# Patient Record
Sex: Female | Born: 1975 | Race: White | Hispanic: Yes | Marital: Married | Smoking: Never smoker
Health system: Southern US, Community
[De-identification: ages and names within clinical notes are randomized; demographics above are authoritative.]

## PROBLEM LIST (undated history)

## (undated) ENCOUNTER — Inpatient Hospital Stay (HOSPITAL_COMMUNITY): Payer: Self-pay

## (undated) DIAGNOSIS — M3500 Sicca syndrome, unspecified: Secondary | ICD-10-CM

## (undated) HISTORY — PX: BREAST SURGERY: SHX581

## (undated) HISTORY — DX: Sjogren syndrome, unspecified: M35.00

## (undated) HISTORY — PX: NO PAST SURGERIES: SHX2092

---

## 2013-04-20 ENCOUNTER — Ambulatory Visit (INDEPENDENT_AMBULATORY_CARE_PROVIDER_SITE_OTHER): Payer: BC Managed Care – PPO | Admitting: Physician Assistant

## 2013-04-20 VITALS — BP 120/75 | HR 80 | Temp 98.9°F | Resp 18 | Wt 121.0 lb

## 2013-04-20 DIAGNOSIS — R599 Enlarged lymph nodes, unspecified: Secondary | ICD-10-CM

## 2013-04-20 DIAGNOSIS — R591 Generalized enlarged lymph nodes: Secondary | ICD-10-CM

## 2013-04-20 DIAGNOSIS — J069 Acute upper respiratory infection, unspecified: Secondary | ICD-10-CM

## 2013-04-20 DIAGNOSIS — D649 Anemia, unspecified: Secondary | ICD-10-CM

## 2013-04-20 LAB — POCT CBC
Granulocyte percent: 69.2 %G (ref 37–80)
Hemoglobin: 11.4 g/dL — AB (ref 12.2–16.2)
MPV: 8.4 fL (ref 0–99.8)
POC Granulocyte: 3.2 (ref 2–6.9)
POC MID %: 7.4 %M (ref 0–12)
Platelet Count, POC: 267 10*3/uL (ref 142–424)
RBC: 4.08 M/uL (ref 4.04–5.48)
WBC: 4.6 10*3/uL (ref 4.6–10.2)

## 2013-04-20 MED ORDER — BENZONATATE 100 MG PO CAPS: 100.0000 mg | ORAL_CAPSULE | Freq: Three times a day (TID) | ORAL | Status: DC | PRN

## 2013-04-20 MED ORDER — IPRATROPIUM BROMIDE 0.03 % NA SOLN: 2.0000 | Freq: Two times a day (BID) | NASAL | Status: DC

## 2013-04-20 NOTE — Progress Notes (Signed)
  Subjective:    Patient ID: Jillian Macias, female    DOB: 05/25/1976, 37 y.o.   MRN: [id number]  HPI This 37 y.o. female presents for evaluation of a small lump under the LEFT ear, which she believes is a swollen lymph node. 4 days ago she developed a scratchy throat, mild non-productive cough and runny nose.  These symptoms began the day after meeting with a client who had similar symptoms.  Yesterday she noted the mildly tender small lump just below the LEFT ear.  No other palpable nodes.  She notes that with Sjogren's flares, she gets enlarged nodes in the groin and supraclavicular regions. No fever, chills.  No GI/GU symptoms.  Past medical history, surgical history, family history, social history and problem list reviewed. She is followed Q3 months by her rheumatologist is [place], [place].  Last labs 1 month ago.   Review of Systems As above.    Objective:   Physical Exam Blood pressure 120/75, pulse 80, temperature 98.9 F (37.2 C), temperature source Oral, resp. rate 18, weight 121 lb (54.885 kg), last menstrual period 04/13/2013. There is no height on file to calculate BMI. Well-developed, well nourished female who is awake, alert and oriented, in NAD. HEENT: [place]/AT, sclera and conjunctiva are clear.  EAC are patent, TMs are normal in appearance. Nasal mucosa is very congested, pink and moist. OP is clear. Neck: supple, non-tender, no thyromegaly. A small (<1 cm) mildly tender node is palpable just behind the LEFT ear.  No other nodes palpable of the head/neck, supraclavicular area, axilla or epitrochlear areas. Heart: RRR, no murmur Lungs: normal effort, CTA Extremities: no cyanosis, clubbing or edema. Skin: warm and dry without rash. Psychologic: good mood and appropriate affect, normal speech and behavior.   Results for orders placed in visit on 04/20/13  POCT CBC      Result Value Range   WBC 4.6  4.6 - 10.2 K/uL   Lymph, poc 1.1  0.6 - 3.4   POC LYMPH PERCENT 23.4  10 - 50  %L   MID (cbc) 0.3  0 - 0.9   POC MID % 7.4  0 - 12 %M   POC Granulocyte 3.2  2 - 6.9   Granulocyte percent 69.2  37 - 80 %G   RBC 4.08  4.04 - 5.48 M/uL   Hemoglobin 11.4 (*) 12.2 - 16.2 g/dL   HCT, POC 78.4 (*) 69.6 - 47.9 %   MCV 90.6  80 - 97 fL   MCH, POC 27.9  27 - 31.2 pg   MCHC 30.8 (*) 31.8 - 35.4 g/dL   RDW, POC 29.5     Platelet Count, POC 267  142 - 424 K/uL   MPV 8.4  0 - 99.8 fL       Assessment & Plan:  Lymphadenopathy - Plan: POCT CBC; likely due to URI.  RTC if symptoms worsen/persist.  Anemia - likely post-menstrual, but advised to consume an iron-rich diet and follow-up here or with her PCP in 4-8 weeks.  URI (upper respiratory infection) - Plan: ipratropium (ATROVENT) 0.03 % nasal spray, benzonatate (TESSALON) 100 MG capsule; supportive care.  Fernande Bras, PA-C Physician Assistant-Certified [facility]

## 2013-04-20 NOTE — Patient Instructions (Signed)
Get plenty of rest and drink at least 64 ounces of water daily. Be sure you're eating and iron-rich diet, though the mild anemia may be related to your recent menstrual blood loss.

## 2014-10-29 LAB — OB RESULTS CONSOLE GC/CHLAMYDIA
Chlamydia: NEGATIVE
GC PROBE AMP, GENITAL: NEGATIVE

## 2014-10-29 LAB — CYTOLOGY - PAP: Pap: NEGATIVE

## 2014-11-12 NOTE — L&D Delivery Note (Cosign Needed)
Delivery Note At 10:15 PM a viable female was delivered via Vaginal, Spontaneous Delivery (Presentation: Left Occiput Anterior), delivered through nuchal x 1 which was reduced immediately at birth.  APGAR: 9, 9; weight 7 lb 10 oz (3459 g).  Infant placed directly on mom's abdomen for bonding/skin-to-skin. Delayed cord clamping, then cord clamped x 2, and cut by fob.   Placenta status: delivered spontaneously intact .  Cord: 3 vessels with the following complications: .  Cord pH: n/a  Anesthesia: Epidural  Episiotomy:  n/a Lacerations: 2nd degree Suture Repair: 3.0 vicryl Est. Blood Loss (mL):  See delivery summary  Mom to postpartum.  Baby to Couplet care / Skin to Skin. Bottlefeeding d/t plaquenil, COCs for contraception Dr. Macon Large present for birth d/t expecting LGA infant/request of pt/family  Marge Duncans 06/25/2015, 11:10 PM

## 2014-12-06 ENCOUNTER — Other Ambulatory Visit (HOSPITAL_COMMUNITY): Payer: Self-pay | Admitting: Nurse Practitioner

## 2014-12-06 DIAGNOSIS — O09521 Supervision of elderly multigravida, first trimester: Secondary | ICD-10-CM

## 2014-12-06 DIAGNOSIS — Z3682 Encounter for antenatal screening for nuchal translucency: Secondary | ICD-10-CM

## 2014-12-06 LAB — URINE CULTURE: Urine Culture, OB: NEGATIVE

## 2014-12-06 LAB — OB RESULTS CONSOLE VARICELLA ZOSTER ANTIBODY, IGG: VARICELLA IGG: IMMUNE

## 2014-12-06 LAB — OB RESULTS CONSOLE ANTIBODY SCREEN: Antibody Screen: NEGATIVE

## 2014-12-06 LAB — OB RESULTS CONSOLE HGB/HCT, BLOOD
HEMATOCRIT: 35 %
HEMOGLOBIN: 11.3 g/dL

## 2014-12-06 LAB — GLUCOSE TOLERANCE, 1 HOUR (50G) W/O FASTING
GLUCOSE 1 HOUR GTT: 107 mg/dL (ref ?–200)
Glucose, GTT - 1 Hour: 107 mg/dL (ref ?–200)

## 2014-12-06 LAB — OB RESULTS CONSOLE ABO/RH: RH Type: POSITIVE

## 2014-12-06 LAB — OB RESULTS CONSOLE PLATELET COUNT: PLATELETS: 270 10*3/uL

## 2014-12-06 LAB — OB RESULTS CONSOLE HEPATITIS B SURFACE ANTIGEN: Hepatitis B Surface Ag: NEGATIVE

## 2014-12-06 LAB — OB RESULTS CONSOLE RUBELLA ANTIBODY, IGM: Rubella: IMMUNE

## 2014-12-06 LAB — SICKLE CELL SCREEN: Sickle Cell Screen: NORMAL

## 2014-12-06 LAB — CYSTIC FIBROSIS DIAGNOSTIC STUDY: Interpretation-CFDNA:: NEGATIVE

## 2014-12-06 LAB — OB RESULTS CONSOLE RPR: RPR: NONREACTIVE

## 2014-12-16 ENCOUNTER — Inpatient Hospital Stay (HOSPITAL_COMMUNITY)
Admission: AD | Admit: 2014-12-16 | Discharge: 2014-12-16 | Disposition: A | Discharge status: Home / Self Care | Payer: Medicaid Other | Source: Ambulatory Visit | Attending: Family Medicine | Admitting: Family Medicine

## 2014-12-16 ENCOUNTER — Encounter (HOSPITAL_COMMUNITY): Payer: Self-pay | Admitting: *Deleted

## 2014-12-16 DIAGNOSIS — M549 Dorsalgia, unspecified: Secondary | ICD-10-CM | POA: Diagnosis present

## 2014-12-16 DIAGNOSIS — Z3A12 12 weeks gestation of pregnancy: Secondary | ICD-10-CM

## 2014-12-16 DIAGNOSIS — M3500 Sicca syndrome, unspecified: Secondary | ICD-10-CM | POA: Diagnosis not present

## 2014-12-16 DIAGNOSIS — O9989 Other specified diseases and conditions complicating pregnancy, childbirth and the puerperium: Secondary | ICD-10-CM | POA: Diagnosis not present

## 2014-12-16 DIAGNOSIS — Z3490 Encounter for supervision of normal pregnancy, unspecified, unspecified trimester: Secondary | ICD-10-CM

## 2014-12-16 LAB — POCT PREGNANCY, URINE: PREG TEST UR: POSITIVE — AB

## 2014-12-16 LAB — URINALYSIS, ROUTINE W REFLEX MICROSCOPIC
Bilirubin Urine: NEGATIVE
GLUCOSE, UA: NEGATIVE mg/dL
HGB URINE DIPSTICK: NEGATIVE
KETONES UR: NEGATIVE mg/dL
LEUKOCYTES UA: NEGATIVE
NITRITE: NEGATIVE
PH: 5.5 (ref 5.0–8.0)
PROTEIN: NEGATIVE mg/dL
Specific Gravity, Urine: 1.01 (ref 1.005–1.030)
Urobilinogen, UA: 0.2 mg/dL (ref 0.0–1.0)

## 2014-12-16 LAB — SEDIMENTATION RATE: Sed Rate: 72 mm/hr — ABNORMAL HIGH (ref 0–22)

## 2014-12-16 MED ORDER — TOBRAMYCIN 0.3 % OP OINT: 1.0000 "application " | TOPICAL_OINTMENT | Freq: Three times a day (TID) | OPHTHALMIC | Status: DC

## 2014-12-16 MED ORDER — ACETAMINOPHEN 325 MG PO TABS
650.0000 mg | ORAL_TABLET | Freq: Once | ORAL | Status: AC
  Administered 2014-12-16: 650 mg via ORAL
  Filled 2014-12-16: qty 2

## 2014-12-16 MED ORDER — CYCLOBENZAPRINE HCL 10 MG PO TABS
10.0000 mg | ORAL_TABLET | Freq: Once | ORAL | Status: AC
  Administered 2014-12-16: 10 mg via ORAL
  Filled 2014-12-16: qty 1

## 2014-12-16 MED ORDER — HYDROXYCHLOROQUINE SULFATE 200 MG PO TABS: 200.0000 mg | ORAL_TABLET | Freq: Two times a day (BID) | ORAL | Status: DC

## 2014-12-16 NOTE — MAU Note (Addendum)
Pt states she has Sjogren's syndrome is thinks she is having a flare up.  Pt states she does not have the systemic type but can't remember if its type one or two Sjogren's.  Reports upper back pain today more on the right upper side at shoulder blade which kept her awake all night.  Also states she has swollen eyes x 1 week.  Pt reports one episode of diarrhea today.

## 2014-12-16 NOTE — Discharge Instructions (Signed)
First Trimester of Pregnancy °The first trimester of pregnancy is from week 1 until the end of week 12 (months 1 through 3). A week after a sperm fertilizes an egg, the egg will implant on the wall of the uterus. This embryo will begin to develop into a baby. Genes from you and your partner are forming the baby. The female genes determine whether the baby is a boy or a girl. At 6-8 weeks, the eyes and face are formed, and the heartbeat can be seen on ultrasound. At the end of 12 weeks, all the baby's organs are formed.  °Now that you are pregnant, you will want to do everything you can to have a healthy baby. Two of the most important things are to get good prenatal care and to follow your health care provider's instructions. Prenatal care is all the medical care you receive before the baby's birth. This care will help prevent, find, and treat any problems during the pregnancy and childbirth. °BODY CHANGES °Your body goes through many changes during pregnancy. The changes vary from woman to woman.  °· You may gain or lose a couple of pounds at first. °· You may feel sick to your stomach (nauseous) and throw up (vomit). If the vomiting is uncontrollable, call your health care provider. °· You may tire easily. °· You may develop headaches that can be relieved by medicines approved by your health care provider. °· You may urinate more often. Painful urination may mean you have a bladder infection. °· You may develop heartburn as a result of your pregnancy. °· You may develop constipation because certain hormones are causing the muscles that push waste through your intestines to slow down. °· You may develop hemorrhoids or swollen, bulging veins (varicose veins). °· Your breasts may begin to grow larger and become tender. Your nipples may stick out more, and the tissue that surrounds them (areola) may become darker. °· Your gums may bleed and may be sensitive to brushing and flossing. °· Dark spots or blotches (chloasma,  mask of pregnancy) may develop on your face. This will likely fade after the baby is born. °· Your menstrual periods will stop. °· You may have a loss of appetite. °· You may develop cravings for certain kinds of food. °· You may have changes in your emotions from day to day, such as being excited to be pregnant or being concerned that something may go wrong with the pregnancy and baby. °· You may have more vivid and strange dreams. °· You may have changes in your hair. These can include thickening of your hair, rapid growth, and changes in texture. Some women also have hair loss during or after pregnancy, or hair that feels dry or thin. Your hair will most likely return to normal after your baby is born. °WHAT TO EXPECT AT YOUR PRENATAL VISITS °During a routine prenatal visit: °· You will be weighed to make sure you and the baby are growing normally. °· Your blood pressure will be taken. °· Your abdomen will be measured to track your baby's growth. °· The fetal heartbeat will be listened to starting around week 10 or 12 of your pregnancy. °· Test results from any previous visits will be discussed. °Your health care provider may ask you: °· How you are feeling. °· If you are feeling the baby move. °· If you have had any abnormal symptoms, such as leaking fluid, bleeding, severe headaches, or abdominal cramping. °· If you have any questions. °Other tests   that may be performed during your first trimester include: °· Blood tests to find your blood type and to check for the presence of any previous infections. They will also be used to check for low iron levels (anemia) and Rh antibodies. Later in the pregnancy, blood tests for diabetes will be done along with other tests if problems develop. °· Urine tests to check for infections, diabetes, or protein in the urine. °· An ultrasound to confirm the proper growth and development of the baby. °· An amniocentesis to check for possible genetic problems. °· Fetal screens for  spina bifida and Down syndrome. °· You may need other tests to make sure you and the baby are doing well. °HOME CARE INSTRUCTIONS  °Medicines °· Follow your health care provider's instructions regarding medicine use. Specific medicines may be either safe or unsafe to take during pregnancy. °· Take your prenatal vitamins as directed. °· If you develop constipation, try taking a stool softener if your health care provider approves. °Diet °· Eat regular, well-balanced meals. Choose a variety of foods, such as meat or vegetable-based protein, fish, milk and low-fat dairy products, vegetables, fruits, and whole grain breads and cereals. Your health care provider will help you determine the amount of weight gain that is right for you. °· Avoid raw meat and uncooked cheese. These carry germs that can cause birth defects in the baby. °· Eating four or five small meals rather than three large meals a day may help relieve nausea and vomiting. If you start to feel nauseous, eating a few soda crackers can be helpful. Drinking liquids between meals instead of during meals also seems to help nausea and vomiting. °· If you develop constipation, eat more high-fiber foods, such as fresh vegetables or fruit and whole grains. Drink enough fluids to keep your urine clear or pale yellow. °Activity and Exercise °· Exercise only as directed by your health care provider. Exercising will help you: °¨ Control your weight. °¨ Stay in shape. °¨ Be prepared for labor and delivery. °· Experiencing pain or cramping in the lower abdomen or low back is a good sign that you should stop exercising. Check with your health care provider before continuing normal exercises. °· Try to avoid standing for long periods of time. Move your legs often if you must stand in one place for a long time. °· Avoid heavy lifting. °· Wear low-heeled shoes, and practice good posture. °· You may continue to have sex unless your health care provider directs you  otherwise. °Relief of Pain or Discomfort °· Wear a good support bra for breast tenderness.   °· Take warm sitz baths to soothe any pain or discomfort caused by hemorrhoids. Use hemorrhoid cream if your health care provider approves.   °· Rest with your legs elevated if you have leg cramps or low back pain. °· If you develop varicose veins in your legs, wear support hose. Elevate your feet for 15 minutes, 3-4 times a day. Limit salt in your diet. °Prenatal Care °· Schedule your prenatal visits by the twelfth week of pregnancy. They are usually scheduled monthly at first, then more often in the last 2 months before delivery. °· Write down your questions. Take them to your prenatal visits. °· Keep all your prenatal visits as directed by your health care provider. °Safety °· Wear your seat belt at all times when driving. °· Make a list of emergency phone numbers, including numbers for family, friends, the hospital, and police and fire departments. °General Tips °·   Ask your health care provider for a referral to a local prenatal education class. Begin classes no later than at the beginning of month 6 of your pregnancy.  Ask for help if you have counseling or nutritional needs during pregnancy. Your health care provider can offer advice or refer you to specialists for help with various needs.  Do not use hot tubs, steam rooms, or saunas.  Do not douche or use tampons or scented sanitary pads.  Do not cross your legs for long periods of time.  Avoid cat litter boxes and soil used by cats. These carry germs that can cause birth defects in the baby and possibly loss of the fetus by miscarriage or stillbirth.  Avoid all smoking, herbs, alcohol, and medicines not prescribed by your health care provider. Chemicals in these affect the formation and growth of the baby.  Schedule a dentist appointment. At home, brush your teeth with a soft toothbrush and be gentle when you floss. SEEK MEDICAL CARE IF:   You have  dizziness.  You have mild pelvic cramps, pelvic pressure, or nagging pain in the abdominal area.  You have persistent nausea, vomiting, or diarrhea.  You have a bad smelling vaginal discharge.  You have pain with urination.  You notice increased swelling in your face, hands, legs, or ankles. SEEK IMMEDIATE MEDICAL CARE IF:   You have a fever.  You are leaking fluid from your vagina.  You have spotting or bleeding from your vagina.  You have severe abdominal cramping or pain.  You have rapid weight gain or loss.  You vomit blood or material that looks like coffee grounds.  You are exposed to [place] measles and have never had them.  You are exposed to fifth disease or chickenpox.  You develop a severe headache.  You have shortness of breath.  You have any kind of trauma, such as from a fall or a car accident. Document Released: 10/23/2001 Document Revised: 03/15/2014 Document Reviewed: 09/08/2013 [facility] Patient Information 2015 [place], [place]. This information is not intended to replace advice given to you by your health care provider. Make sure you discuss any questions you have with your health care provider. Sjogren Syndrome Sjogren syndrome is a disease in which the body's natural defense system (immune system) turns against the body's own cells and attacks the glands that produce tears and saliva. Sjogren syndrome is sometimes linked to rheumatic disorders, such as rheumatoid arthritis and lupus. It is 10 times more common in women than in men. Most people with Sjogren syndrome are from 49 to 6 years of age. CAUSES  The cause is unknown. It runs in families. It is possible that a trigger, such as a viral infection, can set it off. SYMPTOMS  The main symptoms are:  Dry mouth.  Chalky feeling, mouth feels like it is full of cotton.  Difficulty swallowing, speaking, or tasting.  Prone to cavities and mouth infections.  Dry eyes.  Burning, itching, feels like  sand in the eyes.  Blurry vision.  Light sensitive. Other symptoms may include:  Skin, nose, and vaginal dryness.  Joint pain, stiffness, and muscle pain. Other organs that may be affected include:  Kidneys.  Blood vessels.  Lungs.  Liver.  Pancreas.  Brain. DIAGNOSIS  Diagnosis is first based on symptoms, medical history, and physical exam. Tests may also be done, including:  Tear production test (Schirmer test).  A thorough eye exam using a magnifying device (slit-lamp exam).  Test to see the extent of  eye damage using dye staining.  Mouth exam to look for signs of salivary gland swelling and mouth dryness.  Removal of a minor salivary gland from inside the lower lip to be studied under a microscope (lip biopsy).  Blood tests. Routine and special blood studies will be checked. Antibodies that attack normal tissue (autoantibodies) may be present, such as antinuclear antibodies (ANAs), rheumatoid factors, and Sjogren antibodies (anti-SSA and anti-SSB).  Chest X-ray.  Urine tests (urinalysis). TREATMENT  There is no known cure for this syndrome. There is no specific treatment to restore gland secretion, either. Treatment varies and is generally based upon the problems present.  Moisture replacement therapies may ease the symptoms of dryness.  Nonsteroidal anti-inflammatory drugs (NSAIDs), such as ibuprofen, may be used to treat musculoskeletal symptoms.  Corticosteroids, such as prednisone, may be given for people with severe complications.  Immunosuppressive drugs may be prescribed to control overactivity of the immune system. In severe cases, this overactivity can lead to organ damage.  A surgical procedure called punctal occlusion may be considered. This is done to close the tear ducts, helping to keep more natural tears on the eye's surface. A small percentage of people with Sjogren syndrome develop lymphoma. If you are worried that you might develop lymphoma,  talk to your caregiver to learn more about the disease and the symptoms to watch. HOME CARE INSTRUCTIONS   Eye care:  Use eye drops as specified by your caregiver.  Try to blink 5 to 6 times per minute.  Protect your eyes from drafts and breezes.  Maintain properly humidified air.  Avoid smoke.  Mouth care:  Sugar-free gum and hard candy can help in some people.  Take frequent sips of water or sugar-free drinks.  Use lip balm, saliva substitutes, and prescription medicines as directed. SEEK MEDICAL CARE IF:   You have an oral temperature above 102 F (38.9 C).  You have night sweats.  You develop constant fatigue.  You have unexplained weight loss.  You develop itchy skin.  You have reddened patches on the skin. FOR MORE INFORMATION  Sjogren's Syndrome Foundation: www.sjogrens.[facility] National Institute of Arthritis and Musculoskeletal and Skin Diseases: www.niams.http://www.myers.net/nih.gov Document Released: 10/19/2002 Document Revised: 03/15/2014 Document Reviewed: 03/06/2010 [facility] Patient Information 2015 [place]. This information is not intended to replace advice given to you by your health care provider. Make sure you discuss any questions you have with your health care provider.

## 2014-12-16 NOTE — MAU Provider Note (Signed)
History     CSN: [id number]  Arrival date and time: 12/16/14 1038   First Provider Initiated Contact with Patient 12/16/14 1157      Chief Complaint  Patient presents with  . Back Pain   HPI Comments: Natally Ribera 39 y.o. G2P0010 [id number] a PMHx of Sjogren's presenting with a 1 week history of dry, swollen eyes. She reports that these are similar symptoms to her normal Sjogren's flare but more severe. She went to an ophthomalogist a few days ago because her eyes were red, itching, her eyelids were swollen shut and she had green mucoid discharge. He gave her tobramycin 0.3% drops. She reports that it helped clear the mucus up and bring the swelling down but she is still experiencing red eye, "gritty sand feeling", mild swelling and photophobia. She is concerned because her typical flares last 2-3 days and this one has lasted 1 week. She recently moved here and does not established care with a rheumatologist. She is currently taking plaquenil 200 mg QD. She reports no increase in dry mouth symptoms. When she has a flare in past the Rheumatologist will increase her dose of Plaquinil to 200 mg BID  She is also complaining of sudden onset back pain. She reports that severe right upper and mid back pain woke her multiple times in the night. It is described as sharp. It intensified and radiates up to her right posterior neck with movement. Certain position aggravate the pain. Lying still alleviates the pain. She does not recall trauma to the area or overexerting herself. She does not work out. She has not taken any OTC medications to relieve the pain. There is no associated muscle weakness or sensory changes of the arms or legs, bowel incontinence, saddle anesthesia or pain over the spine.  She also reports a single episode of diarrhea this morning while she was at work. She states that she had sudden intense urgency and had one loose stool. No blood in the stool. No abdominal pain associated with the  bowel movement. She reports no feelings of urgency at the present time.   Back Pain Pertinent negatives include no abdominal pain, chest pain, dysuria, fever, headaches or weakness.      Past Medical History  Diagnosis Date  . Sjogren's syndrome     Past Surgical History  Procedure Laterality Date  . Breast surgery      Family History  Problem Relation Age of Onset  . Cancer Maternal Grandmother     History  Substance Use Topics  . Smoking status: Never Smoker   . Smokeless tobacco: Not on file  . Alcohol Use: Yes    Allergies: No Known Allergies  Prescriptions prior to admission  Medication Sig Dispense Refill Last Dose  . benzonatate (TESSALON) 100 MG capsule Take 1-2 capsules (100-200 mg total) by mouth 3 (three) times daily as needed for cough. 40 capsule 0   . ipratropium (ATROVENT) 0.03 % nasal spray Place 2 sprays into the nose 2 (two) times daily. 30 mL 0     Review of Systems  Constitutional: Positive for malaise/fatigue. Negative for fever and chills.  Eyes: Positive for blurred vision, photophobia, pain, discharge and redness.  Respiratory: Negative for cough, shortness of breath and wheezing.   Cardiovascular: Negative for chest pain and palpitations.  Gastrointestinal: Positive for nausea (morning sickness) and diarrhea. Negative for heartburn, vomiting, abdominal pain and blood in stool.  Genitourinary: Negative for dysuria and hematuria.  Musculoskeletal: Positive for back pain. Negative  for joint pain and falls.  Skin: Negative for rash.  Neurological: Negative for dizziness, sensory change, loss of consciousness, weakness and headaches.   Physical Exam   Blood pressure 130/79, pulse 74, temperature 97.7 F (36.5 C), temperature source Oral, resp. rate 16, height 5' 2.75" (1.594 m), weight 56.881 kg (125 lb 6.4 oz), last menstrual period 09/21/2014, SpO2 100 %.  Physical Exam  Constitutional: She appears well-developed and well-nourished.  HENT:   Head: Normocephalic.  Eyes: EOM are normal. Pupils are equal, round, and reactive to light. Right eye exhibits no discharge. No foreign body present in the right eye. Left eye exhibits no discharge. No foreign body present in the left eye. Right conjunctiva is injected. Left conjunctiva is injected.  Cardiovascular: Normal rate and normal heart sounds.   Respiratory: Effort normal and breath sounds normal.  GI: Soft. There is no tenderness.  Musculoskeletal:       Thoracic back: She exhibits tenderness. She exhibits no deformity, no laceration and no spasm.       Back:  Skin: Skin is warm and dry.   Results for orders placed or performed during the hospital encounter of 12/16/14 (from the past 24 hour(s))  Urinalysis, Routine w reflex microscopic     Status: None   Collection Time: 12/16/14 11:10 AM  Result Value Ref Range   Color, Urine YELLOW YELLOW   APPearance CLEAR CLEAR   Specific Gravity, Urine 1.010 1.005 - 1.030   pH 5.5 5.0 - 8.0   Glucose, UA NEGATIVE NEGATIVE mg/dL   Hgb urine dipstick NEGATIVE NEGATIVE   Bilirubin Urine NEGATIVE NEGATIVE   Ketones, ur NEGATIVE NEGATIVE mg/dL   Protein, ur NEGATIVE NEGATIVE mg/dL   Urobilinogen, UA 0.2 0.0 - 1.0 mg/dL   Nitrite NEGATIVE NEGATIVE   Leukocytes, UA NEGATIVE NEGATIVE  Pregnancy, urine POC     Status: Abnormal   Collection Time: 12/16/14 11:21 AM  Result Value Ref Range   Preg Test, Ur POSITIVE (A) NEGATIVE  Sedimentation rate     Status: Abnormal   Collection Time: 12/16/14  1:00 PM  Result Value Ref Range   Sed Rate 72 (H) 0 - 22 mm/hr    MAU Course  Procedures  MDM Flexeril and tylenol were helpful with back  And neck pains Dr Nehemiah Settle met with patient and husband and they agreed to in crease Plaquenil to 239m BID She has an appointment with [facility] Thursday   Assessment and Plan   A: Sjogren's syndrome in pregnancy Increase Plaquinil to 200 mg BID Sjogren's syndrome A and B drawn per  MFM Tobrex 1-2 gtts BID to TID Keep appointment with Clinic on Thursday or sooner if needed Return to MAU as needed    BGeorgia Duff2/02/2015, 12:24 PM

## 2014-12-17 ENCOUNTER — Encounter (HOSPITAL_COMMUNITY): Payer: Self-pay

## 2014-12-17 ENCOUNTER — Ambulatory Visit (HOSPITAL_COMMUNITY)
Admission: RE | Admit: 2014-12-17 | Discharge: 2014-12-17 | Disposition: A | Discharge status: Home / Self Care | Payer: Medicaid Other | Source: Ambulatory Visit | Attending: Nurse Practitioner | Admitting: Nurse Practitioner

## 2014-12-17 ENCOUNTER — Other Ambulatory Visit (HOSPITAL_COMMUNITY): Payer: Self-pay | Admitting: Maternal and Fetal Medicine

## 2014-12-17 ENCOUNTER — Other Ambulatory Visit (HOSPITAL_COMMUNITY): Payer: Self-pay

## 2014-12-17 DIAGNOSIS — O09529 Supervision of elderly multigravida, unspecified trimester: Secondary | ICD-10-CM | POA: Insufficient documentation

## 2014-12-17 DIAGNOSIS — O09521 Supervision of elderly multigravida, first trimester: Secondary | ICD-10-CM | POA: Insufficient documentation

## 2014-12-17 DIAGNOSIS — Z315 Encounter for genetic counseling: Secondary | ICD-10-CM | POA: Diagnosis not present

## 2014-12-17 DIAGNOSIS — Z3682 Encounter for antenatal screening for nuchal translucency: Secondary | ICD-10-CM

## 2014-12-17 DIAGNOSIS — Z369 Encounter for antenatal screening, unspecified: Secondary | ICD-10-CM | POA: Insufficient documentation

## 2014-12-17 DIAGNOSIS — M3500 Sicca syndrome, unspecified: Secondary | ICD-10-CM

## 2014-12-17 DIAGNOSIS — Z3A12 12 weeks gestation of pregnancy: Secondary | ICD-10-CM | POA: Insufficient documentation

## 2014-12-17 DIAGNOSIS — Z3A11 11 weeks gestation of pregnancy: Secondary | ICD-10-CM | POA: Insufficient documentation

## 2014-12-17 DIAGNOSIS — O09522 Supervision of elderly multigravida, second trimester: Secondary | ICD-10-CM

## 2014-12-17 LAB — SJOGRENS SYNDROME-B EXTRACTABLE NUCLEAR ANTIBODY: SSB (LA) (ENA) ANTIBODY, IGG: POSITIVE

## 2014-12-17 LAB — SJOGRENS SYNDROME-A EXTRACTABLE NUCLEAR ANTIBODY: SSA (RO) (ENA) ANTIBODY, IGG: POSITIVE

## 2014-12-17 IMAGING — US US MFM FETAL NUCHAL TRANSLUCENCY
1 series · 13 of 28 positions shown · non-contrast
Comparison: none

[Series 1: us mfm fetal nuchal translucency · 0.17mm/px · 13 of 30 slices shown]
[im 2/30]
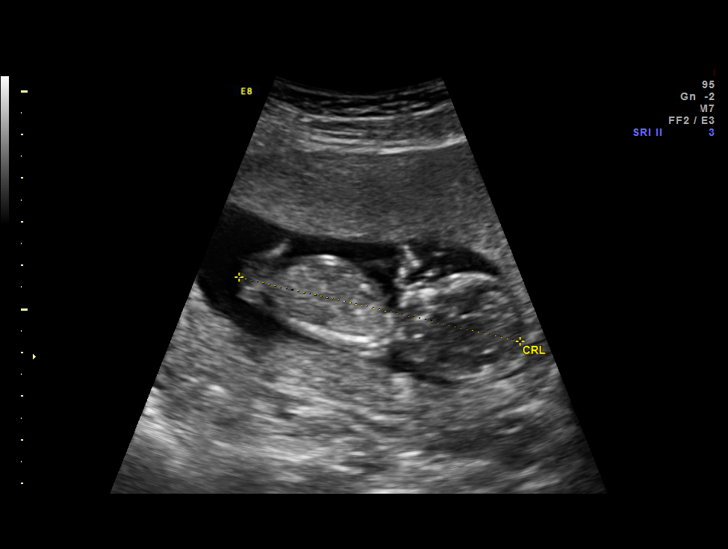
[im 4/30]
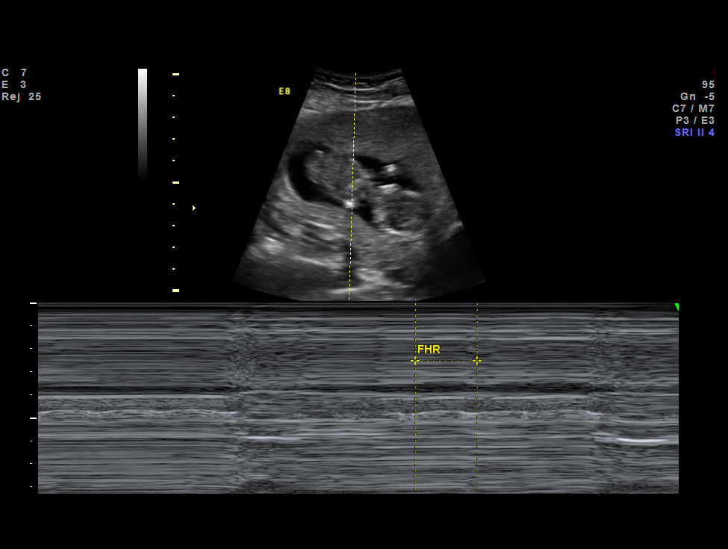
[im 6/30]
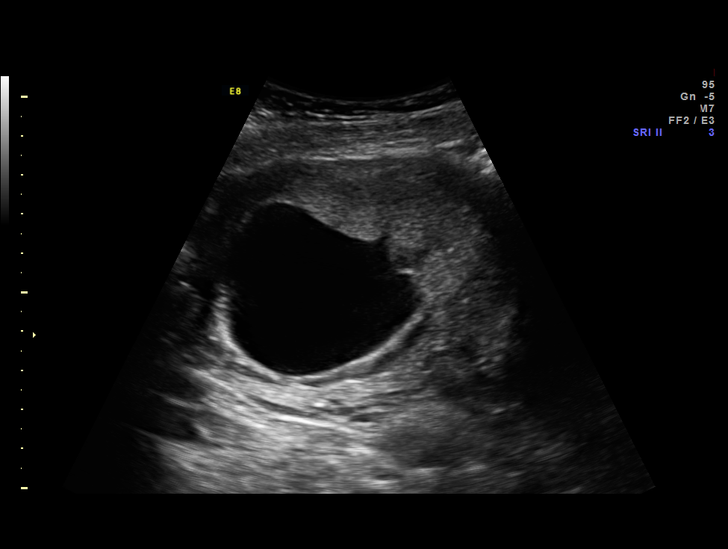
[im 8/30]
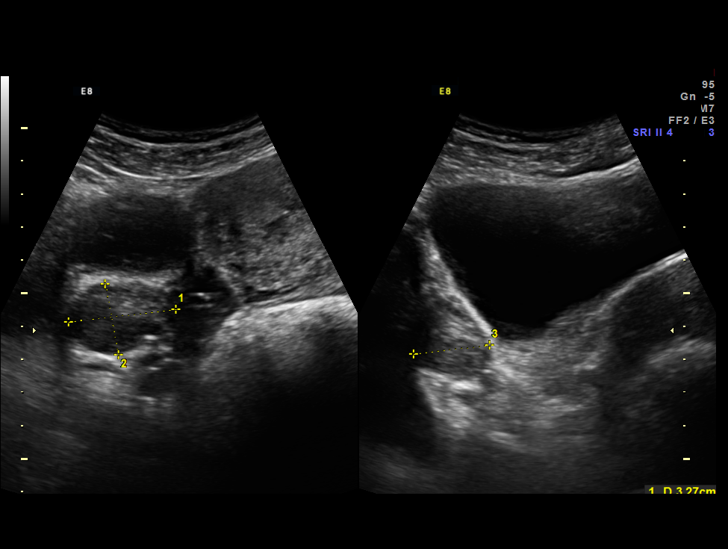
[im 10/30]
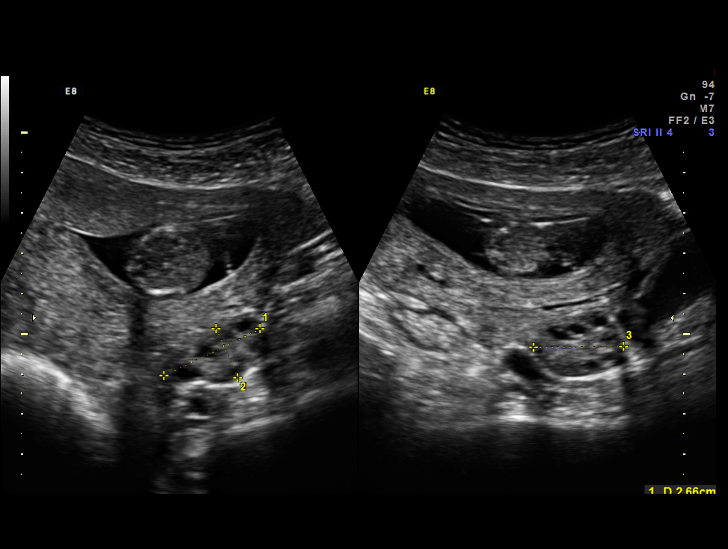
[im 12/30]
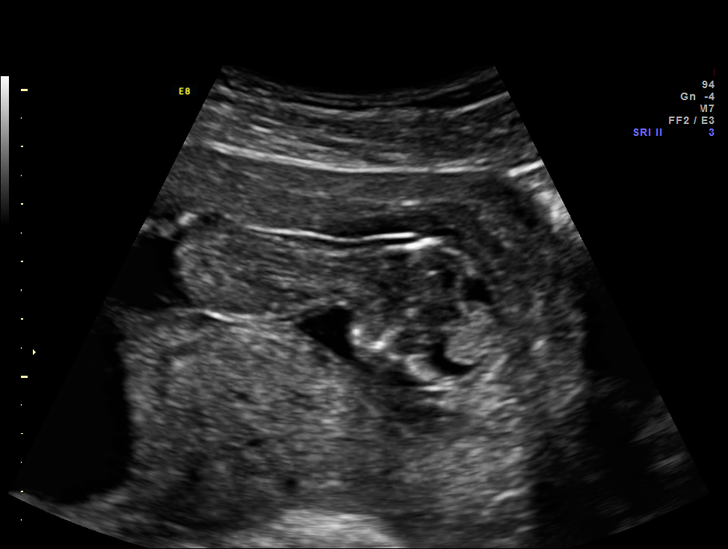
[im 16/30]
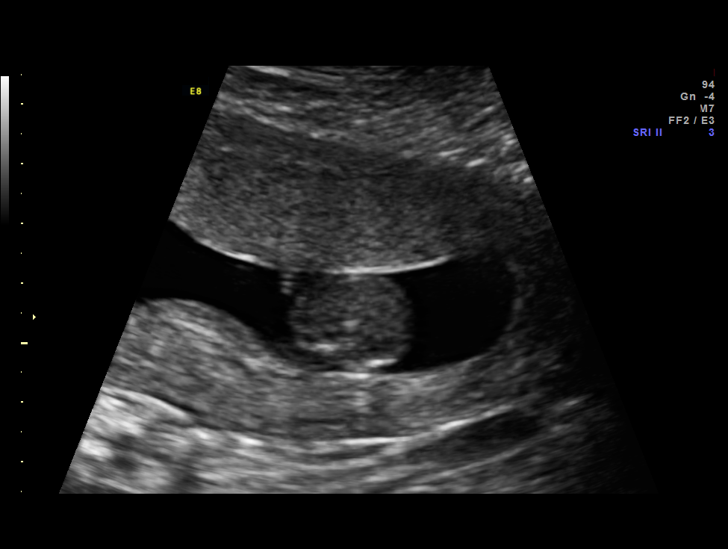
[im 18/30]
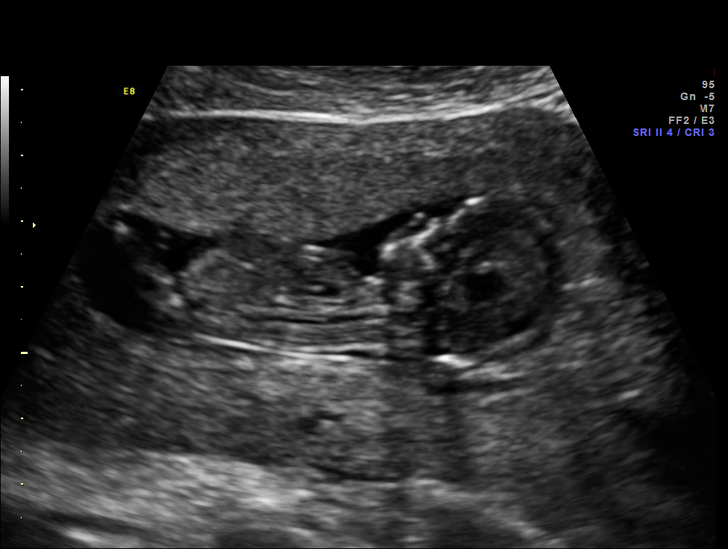
[im 20/30]
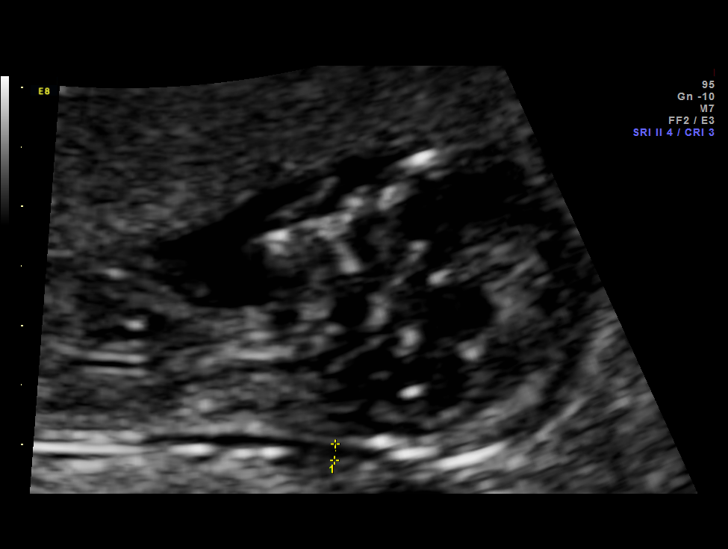
[im 22/30]
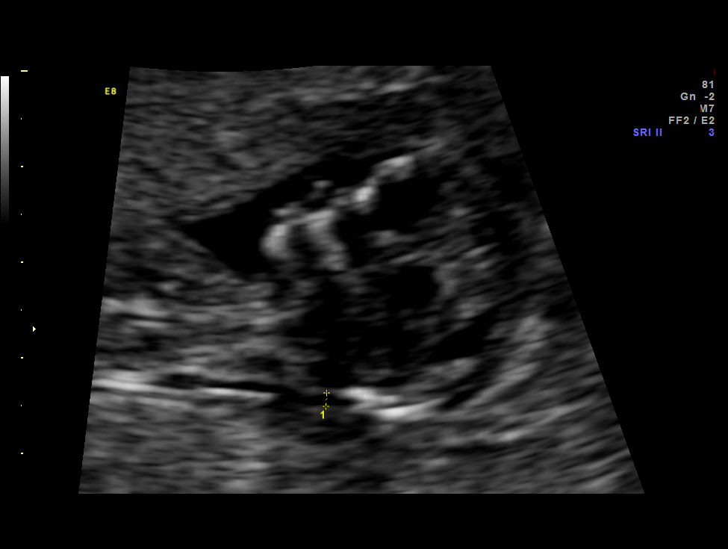
[im 24/30]
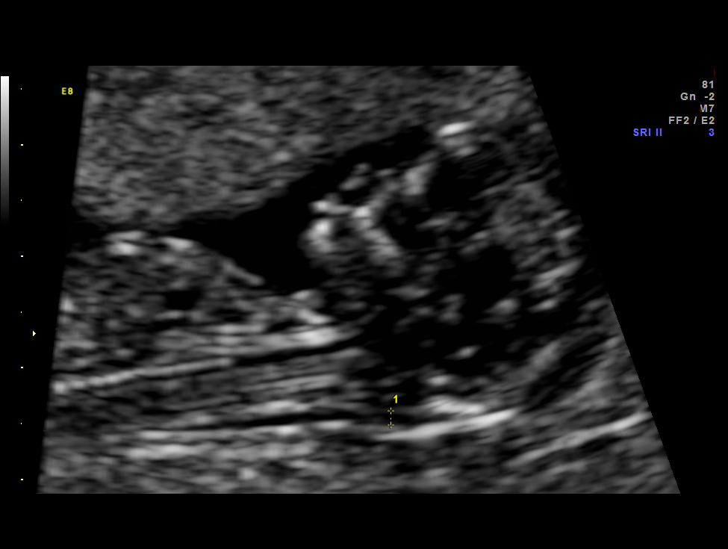
[im 26/30]
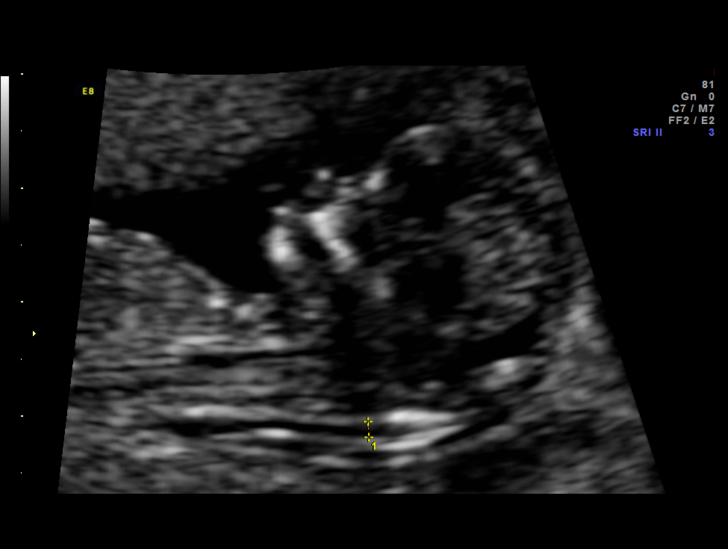
[im 28/30]
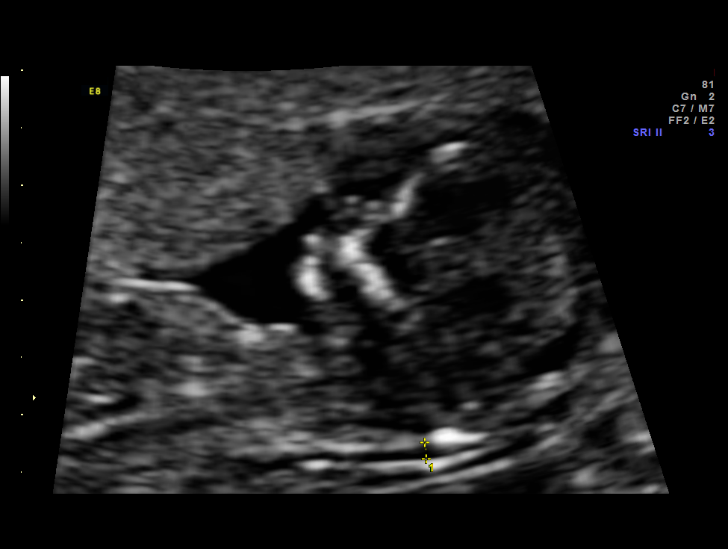

[13 of 28 positions shown; findings below may reference images not displayed]

OBSTETRICS REPORT
                      (Signed Final 12/17/2014 [DATE])

Service(s) Provided

Indications

 Advanced maternal age multigravida 35+, first
 trimester
 Sjogren's Syndrome
 First trimester aneuploidy screen (NT)                Z36
Fetal Evaluation

 Num Of Fetuses:    1
 Fetal Heart Rate:  163                          bpm
 Cardiac Activity:  Observed
 Presentation:      Transverse, head to
                    maternal left
 Placenta:          Anterior

 Amniotic Fluid
 AFI FV:      Subjectively within normal limits
Biometry

 CRL:     65.2  mm     G. Age:  12w 5d                 EDD:    06/26/15
 NT:       1.4  mm
Gestational Age

 LMP:           12w 3d        Date:  09/21/14                 EDD:   06/28/15
 Best:          12w 3d     Det. By:  LMP  (09/21/14)          EDD:   06/28/15
Cervix Uterus Adnexa

 Cervix:       Normal appearance by transabdominal scan.
 Left Ovary:    Within normal limits.
 Right Ovary:   Within normal limits.
 Adnexa:     No abnormality visualized.
Impression

 SIUP at 84w8d
 NT = 1.4mm
 Nasal bone present
 Fetal morphology is gestational age appropriate
Recommendations

 1. see genetic counseling
 2. anatomic survey in 6 weeks.

## 2014-12-17 NOTE — Progress Notes (Signed)
Genetic Counseling  High-Risk Gestation Note  Appointment Date:  12/17/2014 Referred By: Alberteen Spindlearson, Ashley C, NP Date of Birth:  02-01-1976 Partner:  Jillian Macias   Pregnancy History: G2P0010 Estimated Date of Delivery: 07/08/15 Estimated Gestational Age: 3392w0d Attending: Damaris HippoJeffrey Denney, MD  Ms. Jillian Macias and her partner, Mr. Jillian BanksOscar Macias, were seen for genetic counseling because of a maternal age of 39 y.o..   She will be 39 years old at delivery.   They were counseled regarding maternal age and the association with risk for chromosome conditions due to nondisjunction with aging of the ova.   We reviewed chromosomes, nondisjunction, and the associated 1 in 6251 risk for fetal aneuploidy at 5692w0d gestation related to a maternal age of 39 y.o. at delivery.  They were counseled that the risk for aneuploidy decreases as gestational age increases, accounting for those pregnancies which spontaneously abort.  We specifically discussed Down syndrome (trisomy 6521), trisomies 5013 and 7318, and sex chromosome aneuploidies (47,XXX and 47,XXY) including the common features and prognoses of each.   We reviewed available screening options including First Screen, Quad screen, noninvasive prenatal screening (NIPS)/cell free DNA (cfDNA) testing, and detailed ultrasound.  They were counseled that screening tests are used to modify a patient's a priori risk for aneuploidy, typically based on age.  This estimate provides a pregnancy specific risk assessment. We reviewed the benefits and limitations of each option.  Specifically, we discussed the conditions for which each test screens, the detection rates, and false positive rates of each.  They were also counseled regarding diagnostic testing via CVS and amniocentesis. We reviewed the approximate 1 in 100 risk for complications for CVS and the approximate 1 in 300-500 risk for complications for amniocentesis, including spontaneous pregnancy loss. After consideration of  all the options, they elected to proceed with NIPS ([facility]).  Those results will be available in 8-10 days. They declined CVS and amniocentesis at this time.    They also had a nuchal translucency ultrasound performed today.  The report will be documented separately.  The patient elected to return for detailed ultrasound, which was scheduled for 01/28/15. She understands that screening tests cannot rule out all birth defects or genetic syndromes. The patient was advised of this limitation and states she still does not want additional testing at this time.   We discussed sickle cell anemia (SCA) including the carrier frequency and incidence in the Hispanic population, the availability of carrier testing and prenatal diagnosis if indicated.  In addition, we discussed that hemoglobinopathies are routinely screened for as part of the [place] newborn screening panel.  Hemoglobin electrophoresis was previously performed through the patient's OB office and was normal.   Ms. Jillian Macias was provided with written information regarding cystic fibrosis (CF) including the carrier frequency and incidence in the Caucasian population, the availability of carrier testing and prenatal diagnosis if indicated.  In addition, we discussed that CF is routinely screened for as part of the [place] newborn screening panel.  CF carrier screening was previously performed through her OB and was negative for the mutations screened. Thus, her risk to be a CF carrier has been reduced but not eliminated.   Both family histories were reviewed and found to be noncontributory for birth defects, intellectual disability, and known genetic conditions. Without further information regarding the provided family history, an accurate genetic risk cannot be calculated. Further genetic counseling is warranted if more information is obtained.  Ms. Jillian Macias denied exposure  to environmental toxins or chemical  agents. She denied the use of alcohol, tobacco or street drugs. She denied significant viral illnesses during the course of her pregnancy. Her medical and surgical histories were contributory for Sjogren's syndrome, for which she is currently treated with plaquenil. Ms. Jim Like is reportedly managed by a rheumatologist. MFM consultation was scheduled for 01/28/15 to further discuss this medical history.   I counseled this couple regarding the above risks and available options.  The approximate face-to-face time with the genetic counselor was 45 minutes.  Quinn Plowman, MS,  Certified Genetic Counselor 12/17/2014

## 2014-12-23 ENCOUNTER — Encounter: Payer: Self-pay | Admitting: Obstetrics & Gynecology

## 2014-12-23 ENCOUNTER — Ambulatory Visit (INDEPENDENT_AMBULATORY_CARE_PROVIDER_SITE_OTHER): Payer: Self-pay | Admitting: Obstetrics & Gynecology

## 2014-12-23 VITALS — BP 118/75 | HR 81 | Ht 64.0 in | Wt 125.0 lb

## 2014-12-23 DIAGNOSIS — M3500 Sicca syndrome, unspecified: Secondary | ICD-10-CM

## 2014-12-23 DIAGNOSIS — O0991 Supervision of high risk pregnancy, unspecified, first trimester: Secondary | ICD-10-CM

## 2014-12-23 DIAGNOSIS — O0990 Supervision of high risk pregnancy, unspecified, unspecified trimester: Secondary | ICD-10-CM | POA: Insufficient documentation

## 2014-12-23 LAB — POCT URINALYSIS DIP (DEVICE)
Bilirubin Urine: NEGATIVE
Glucose, UA: NEGATIVE mg/dL
Hgb urine dipstick: NEGATIVE
Ketones, ur: NEGATIVE mg/dL
LEUKOCYTES UA: NEGATIVE
Nitrite: NEGATIVE
PH: 6 (ref 5.0–8.0)
PROTEIN: NEGATIVE mg/dL
SPECIFIC GRAVITY, URINE: 1.01 (ref 1.005–1.030)
Urobilinogen, UA: 0.2 mg/dL (ref 0.0–1.0)

## 2014-12-23 NOTE — Progress Notes (Signed)
Nutrition note: 1st visit consult Pt has gained 7# @ 5145w2d, which is slightly > expected. Pt reports eating 3 meals & several snacks/d. Pt is taking a PNV. Pt reports a little nausea, which is mostly better & no heartburn. NKFA. Pt has Sjogren's Syndrome. Pt received verbal & written education on general nutrition during pregnancy. Encouraged pt to talk with doctor about BF with her medicine (Plaquenil). Discussed wt gain goals of 25-35# or 1#/wk. Pt agrees to continue taking her PNV. Pt does not have WIC but plans to apply. Pt is unsure about BF due to her taking Plaquenil. F/u as needed Blondell RevealLaura Siedah Sedor, MS, RD, LDN, [facility]

## 2014-12-23 NOTE — Patient Instructions (Signed)
Second Trimester of Pregnancy The second trimester is from week 13 through week 28, months 4 through 6. The second trimester is often a time when you feel your best. Your body has also adjusted to being pregnant, and you begin to feel better physically. Usually, morning sickness has lessened or quit completely, you may have more energy, and you may have an increase in appetite. The second trimester is also a time when the fetus is growing rapidly. At the end of the sixth month, the fetus is about 9 inches long and weighs about 1 pounds. You will likely begin to feel the baby move (quickening) between 18 and 20 weeks of the pregnancy. BODY CHANGES Your body goes through many changes during pregnancy. The changes vary from woman to woman.   Your weight will continue to increase. You will notice your lower abdomen bulging out.  You may begin to get stretch marks on your hips, abdomen, and breasts.  You may develop headaches that can be relieved by medicines approved by your health care provider.  You may urinate more often because the fetus is pressing on your bladder.  You may develop or continue to have heartburn as a result of your pregnancy.  You may develop constipation because certain hormones are causing the muscles that push waste through your intestines to slow down.  You may develop hemorrhoids or swollen, bulging veins (varicose veins).  You may have back pain because of the weight gain and pregnancy hormones relaxing your joints between the bones in your pelvis and as a result of a shift in weight and the muscles that support your balance.  Your breasts will continue to grow and be tender.  Your gums may bleed and may be sensitive to brushing and flossing.  Dark spots or blotches (chloasma, mask of pregnancy) may develop on your face. This will likely fade after the baby is born.  A dark line from your belly button to the pubic area (linea nigra) may appear. This will likely fade  after the baby is born.  You may have changes in your hair. These can include thickening of your hair, rapid growth, and changes in texture. Some women also have hair loss during or after pregnancy, or hair that feels dry or thin. Your hair will most likely return to normal after your baby is born. WHAT TO EXPECT AT YOUR PRENATAL VISITS During a routine prenatal visit:  You will be weighed to make sure you and the fetus are growing normally.  Your blood pressure will be taken.  Your abdomen will be measured to track your baby's growth.  The fetal heartbeat will be listened to.  Any test results from the previous visit will be discussed. Your health care provider may ask you:  How you are feeling.  If you are feeling the baby move.  If you have had any abnormal symptoms, such as leaking fluid, bleeding, severe headaches, or abdominal cramping.  If you have any questions. Other tests that may be performed during your second trimester include:  Blood tests that check for:  Low iron levels (anemia).  Gestational diabetes (between 24 and 28 weeks).  Rh antibodies.  Urine tests to check for infections, diabetes, or protein in the urine.  An ultrasound to confirm the proper growth and development of the baby.  An amniocentesis to check for possible genetic problems.  Fetal screens for spina bifida and Down syndrome. HOME CARE INSTRUCTIONS   Avoid all smoking, herbs, alcohol, and unprescribed   drugs. These chemicals affect the formation and growth of the baby.  Follow your health care provider's instructions regarding medicine use. There are medicines that are either safe or unsafe to take during pregnancy.  Exercise only as directed by your health care provider. Experiencing uterine cramps is a good sign to stop exercising.  Continue to eat regular, healthy meals.  Wear a good support bra for breast tenderness.  Do not use hot tubs, steam rooms, or saunas.  Wear your  seat belt at all times when driving.  Avoid raw meat, uncooked cheese, cat litter boxes, and soil used by cats. These carry germs that can cause birth defects in the baby.  Take your prenatal vitamins.  Try taking a stool softener (if your health care provider approves) if you develop constipation. Eat more high-fiber foods, such as fresh vegetables or fruit and whole grains. Drink plenty of fluids to keep your urine clear or pale yellow.  Take warm sitz baths to soothe any pain or discomfort caused by hemorrhoids. Use hemorrhoid cream if your health care provider approves.  If you develop varicose veins, wear support hose. Elevate your feet for 15 minutes, 3-4 times a day. Limit salt in your diet.  Avoid heavy lifting, wear low heel shoes, and practice good posture.  Rest with your legs elevated if you have leg cramps or low back pain.  Visit your dentist if you have not gone yet during your pregnancy. Use a soft toothbrush to brush your teeth and be gentle when you floss.  A sexual relationship may be continued unless your health care provider directs you otherwise.  Continue to go to all your prenatal visits as directed by your health care provider. SEEK MEDICAL CARE IF:   You have dizziness.  You have mild pelvic cramps, pelvic pressure, or nagging pain in the abdominal area.  You have persistent nausea, vomiting, or diarrhea.  You have a bad smelling vaginal discharge.  You have pain with urination. SEEK IMMEDIATE MEDICAL CARE IF:   You have a fever.  You are leaking fluid from your vagina.  You have spotting or bleeding from your vagina.  You have severe abdominal cramping or pain.  You have rapid weight gain or loss.  You have shortness of breath with chest pain.  You notice sudden or extreme swelling of your face, hands, ankles, feet, or legs.  You have not felt your baby move in over an hour.  You have severe headaches that do not go away with  medicine.  You have vision changes. Document Released: 10/23/2001 Document Revised: 11/03/2013 Document Reviewed: 12/30/2012 ExitCare Patient Information 2015 [employer]. This information is not intended to replace advice given to you by your health care provider. Make sure you discuss any questions you have with your health care provider.  

## 2014-12-23 NOTE — Progress Notes (Signed)
Pt reports some upper back pain.  Pt declines HIV testing- states that she has been tested many times in the past and doesn't feel its necessary now.

## 2014-12-27 ENCOUNTER — Other Ambulatory Visit (HOSPITAL_COMMUNITY): Payer: Self-pay

## 2014-12-29 ENCOUNTER — Encounter: Payer: Self-pay | Admitting: *Deleted

## 2014-12-29 ENCOUNTER — Telehealth (HOSPITAL_COMMUNITY): Payer: Self-pay | Admitting: Genetics

## 2014-12-29 NOTE — Telephone Encounter (Signed)
Ms  Jillian Macias  Called to discuss her cell free fetal DNA test results.  Mrs. Jillian Macias [facility].  Testing was offered because of AMA.   The patient was identified by name and DOB.  We reviewed that these are within normal limits, showing a less than 1 in 10,000 risk for trisomies 21, 18 and 13, and monosomy X (Turner syndrome).  In addition, the risk for triploidy/vanishing twin and sex chromosome trisomies (47,XXX and 47,XXY) was also low risk.  We reviewed that this testing identifies > 99% of pregnancies with trisomy 2021, trisomy 513, sex chromosome trisomies (47,XXX and 47,XXY), and triploidy. The detection rate for trisomy 18 is 96%.  The detection rate for monosomy X is ~92%.  The false positive rate is <0.1% for all conditions. Testing was also consistent with female fetal sex.  The patient did wish to know fetal sex.  She understands that this testing does not identify all genetic conditions.  All questions were answered to her satisfaction, she was encouraged to call with additional questions or concerns.  Jillian Macias,Jillian Morace M, MS Certified Genetic Counselor

## 2014-12-30 ENCOUNTER — Encounter: Payer: Self-pay | Admitting: Obstetrics & Gynecology

## 2014-12-30 ENCOUNTER — Other Ambulatory Visit (HOSPITAL_COMMUNITY): Payer: Self-pay

## 2015-01-03 ENCOUNTER — Telehealth: Payer: Self-pay | Admitting: *Deleted

## 2015-01-03 NOTE — Telephone Encounter (Signed)
Jillian Macias left a message that she had a question. States she will be 15 weeks tomorrow and is feeling fine but had a little spotting today for the first time and it was dark brown. Wanted to know if that was normal or does she need to get it checked out.  Called [name]- she states she has had no further bleeding or spotting and it was dark brown. She denies cramping or pain.  We discussed that since it was dark that was old blood. We discussed can be normal after intercourse, vaginal exam or vaginal ultrasound. She states last intercourse was 2 days ago. I advised her to come to MAU if she has any bright red bleeding or spotting , or if she had any pain.  She voices understanding.

## 2015-01-10 ENCOUNTER — Inpatient Hospital Stay (HOSPITAL_COMMUNITY)
Admission: AD | Admit: 2015-01-10 | Discharge: 2015-01-10 | Disposition: A | Discharge status: Home / Self Care | Payer: Medicaid Other | Source: Ambulatory Visit | Attending: Obstetrics and Gynecology | Admitting: Obstetrics and Gynecology

## 2015-01-10 ENCOUNTER — Encounter (HOSPITAL_COMMUNITY): Payer: Self-pay | Admitting: *Deleted

## 2015-01-10 DIAGNOSIS — M5430 Sciatica, unspecified side: Secondary | ICD-10-CM | POA: Insufficient documentation

## 2015-01-10 DIAGNOSIS — M5431 Sciatica, right side: Secondary | ICD-10-CM

## 2015-01-10 DIAGNOSIS — Z3A16 16 weeks gestation of pregnancy: Secondary | ICD-10-CM | POA: Diagnosis not present

## 2015-01-10 DIAGNOSIS — M3500 Sicca syndrome, unspecified: Secondary | ICD-10-CM | POA: Insufficient documentation

## 2015-01-10 DIAGNOSIS — Z3492 Encounter for supervision of normal pregnancy, unspecified, second trimester: Secondary | ICD-10-CM

## 2015-01-10 DIAGNOSIS — O9989 Other specified diseases and conditions complicating pregnancy, childbirth and the puerperium: Secondary | ICD-10-CM | POA: Insufficient documentation

## 2015-01-10 DIAGNOSIS — M545 Low back pain: Secondary | ICD-10-CM | POA: Diagnosis present

## 2015-01-10 DIAGNOSIS — Z3A15 15 weeks gestation of pregnancy: Secondary | ICD-10-CM | POA: Diagnosis not present

## 2015-01-10 LAB — URINALYSIS, ROUTINE W REFLEX MICROSCOPIC
Bilirubin Urine: NEGATIVE
Glucose, UA: NEGATIVE mg/dL
Hgb urine dipstick: NEGATIVE
Ketones, ur: NEGATIVE mg/dL
Leukocytes, UA: NEGATIVE
Nitrite: NEGATIVE
PH: 5.5 (ref 5.0–8.0)
PROTEIN: NEGATIVE mg/dL
Specific Gravity, Urine: <1.005 — ABNORMAL LOW (ref 1.005–1.030)
Urobilinogen, UA: 0.2 mg/dL (ref 0.0–1.0)

## 2015-01-10 MED ORDER — CYCLOBENZAPRINE HCL 10 MG PO TABS: ORAL_TABLET | ORAL | Status: DC

## 2015-01-10 NOTE — MAU Note (Signed)
Urine in lab 

## 2015-01-10 NOTE — Discharge Instructions (Signed)

## 2015-01-10 NOTE — MAU Note (Signed)
For the past 4 days, has been having pain in tailbone- shoots down rt leg.  Couldn't walk today it hurts so bad.

## 2015-01-10 NOTE — MAU Provider Note (Signed)
History     CSN: [id number]  Arrival date and time: 01/10/15 1800   None     Chief Complaint  Patient presents with  . Tailbone Pain   HPI Jillian Macias is 39 y.o. G2P0010 6451w6d weeks presenting with lower right back pain that shoots down her leg with walking.  She is comfortable with sitting and standing.  This pain began today at work.  She is a patient in the Clinic.  Next appt in 2 weeks.  She was seen in MAU 2/4 with upper back pain but this is clearly different.  She was given a Flexeril tab at last visit that relieved her pain in 30 minutes.  She denies vaginal bleeding and abdominal pain.    Past Medical History  Diagnosis Date  . Sjogren's syndrome     Past Surgical History  Procedure Laterality Date  . Breast surgery      Family History  Problem Relation Age of Onset  . Cancer Maternal Grandmother     History  Substance Use Topics  . Smoking status: Never Smoker   . Smokeless tobacco: Never Used  . Alcohol Use: No    Allergies: No Known Allergies  Prescriptions prior to admission  Medication Sig Dispense Refill Last Dose  . hydroxychloroquine (PLAQUENIL) 200 MG tablet Take 1 tablet (200 mg total) by mouth 2 (two) times daily. 60 tablet 1 01/09/2015 at Unknown time  . Prenatal Vit-Fe Fumarate-FA (PRENATAL MULTIVITAMIN) TABS tablet Take 1 tablet by mouth daily at 12 noon.   01/09/2015 at Unknown time  . tobramycin (TOBREX) 0.3 % ophthalmic ointment Place 1 application into both eyes 3 (three) times daily. 3.5 g 0     Review of Systems  Constitutional: Negative for fever and chills.  Cardiovascular: Negative for chest pain.  Gastrointestinal: Negative for nausea, vomiting and abdominal pain.  Genitourinary:       Neg for vaginal bleeding.  Musculoskeletal: Positive for back pain (lower back pain just to the right of her spine).  Neurological: Negative for headaches.  Psychiatric/Behavioral: The patient is nervous/anxious.    Physical Exam   Blood  pressure 119/82, pulse 75, temperature 98.2 F (36.8 C), temperature source Oral, resp. rate 18, weight 129 lb (58.514 kg), last menstrual period 09/21/2014.  Physical Exam  Constitutional: She is oriented to person, place, and time. She appears well-developed and well-nourished.  HENT:  Head: Normocephalic.  Neck: Normal range of motion.  Cardiovascular: Normal rate.   Respiratory: Effort normal.  GI: There is no tenderness. There is no rebound and no guarding.  Genitourinary:  deferred  Musculoskeletal:       Right shoulder: She exhibits pain (lower right sided pain to the right of her spine).       Arms: Neurological: She is alert and oriented to person, place, and time.  Skin: Skin is warm and dry.  Psychiatric: She has a normal mood and affect. Her behavior is normal. Thought content normal.    MAU Course  Procedures  MDM  Care turned over to J. Magnus SinningWenzel, PA.  Assessment and Plan  A;  Sciactica pain in second trimester pregnancy  P:  Warm soaks       Rx for Flexeril--patient driving will not give in MAU      Keep scheduled appointment with Clinic. Call for worsening sxs       KEY,EVE M 01/10/2015, 7:47 PM   Results for orders placed or performed during the hospital encounter of 01/10/15 (from the past  24 hour(s))  Urinalysis, Routine w reflex microscopic     Status: Abnormal   Collection Time: 01/10/15  7:41 PM  Result Value Ref Range   Color, Urine YELLOW YELLOW   APPearance CLEAR CLEAR   Specific Gravity, Urine <1.005 (L) 1.005 - 1.030   pH 5.5 5.0 - 8.0   Glucose, UA NEGATIVE NEGATIVE mg/dL   Hgb urine dipstick NEGATIVE NEGATIVE   Bilirubin Urine NEGATIVE NEGATIVE   Ketones, ur NEGATIVE NEGATIVE mg/dL   Protein, ur NEGATIVE NEGATIVE mg/dL   Urobilinogen, UA 0.2 0.0 - 1.0 mg/dL   Nitrite NEGATIVE NEGATIVE   Leukocytes, UA NEGATIVE NEGATIVE   MDM UA today shows no signs of infection  A: SIUP at [redacted]w[redacted]d Sciatic nerve pain  P: As noted above  Marny Lowenstein, PA-C 01/10/2015 8:38 PM

## 2015-01-14 ENCOUNTER — Encounter: Payer: Self-pay | Admitting: *Deleted

## 2015-01-17 ENCOUNTER — Telehealth: Payer: Self-pay

## 2015-01-17 NOTE — Telephone Encounter (Signed)
Pt called and stated that she is 16w pregnant and she woke up with diarrhea x 2.  Looking in pts chart pt is not a patient of our clinics nor does she have an appt scheduled for prenatal care.  Please find out if and where pt is receiving her prenatal care from.   Called pt and LM that we are returning her call to please give us a call back.

## 2015-01-18 NOTE — Telephone Encounter (Signed)
Called pt and LM that this is our second attempt in trying to reach you if you continue to have questions or concerns please give the Clinics a call.

## 2015-01-20 ENCOUNTER — Ambulatory Visit (INDEPENDENT_AMBULATORY_CARE_PROVIDER_SITE_OTHER): Payer: No Typology Code available for payment source | Admitting: Family Medicine

## 2015-01-20 VITALS — BP 114/63 | HR 79 | Temp 98.0°F | Wt 131.9 lb

## 2015-01-20 DIAGNOSIS — M3500 Sicca syndrome, unspecified: Secondary | ICD-10-CM

## 2015-01-20 DIAGNOSIS — O0992 Supervision of high risk pregnancy, unspecified, second trimester: Secondary | ICD-10-CM

## 2015-01-20 LAB — POCT URINALYSIS DIP (DEVICE)
Bilirubin Urine: NEGATIVE
GLUCOSE, UA: NEGATIVE mg/dL
HGB URINE DIPSTICK: NEGATIVE
KETONES UR: NEGATIVE mg/dL
Leukocytes, UA: NEGATIVE
Nitrite: NEGATIVE
PROTEIN: NEGATIVE mg/dL
SPECIFIC GRAVITY, URINE: 1.01 (ref 1.005–1.030)
UROBILINOGEN UA: 0.2 mg/dL (ref 0.0–1.0)
pH: 7 (ref 5.0–8.0)

## 2015-01-20 NOTE — Progress Notes (Signed)
C/o intermittent lower back pain.

## 2015-01-20 NOTE — Progress Notes (Signed)
Patient is 39 y.o. G2P0010 1970w2d.  Overall feeling well.  Currently denies vaginal bleeding, spotting, but does note vaginal discharge

## 2015-01-28 ENCOUNTER — Encounter (HOSPITAL_COMMUNITY): Payer: Self-pay

## 2015-01-28 ENCOUNTER — Other Ambulatory Visit (HOSPITAL_COMMUNITY): Payer: Self-pay | Admitting: Maternal and Fetal Medicine

## 2015-01-28 ENCOUNTER — Ambulatory Visit (HOSPITAL_COMMUNITY)
Admission: RE | Admit: 2015-01-28 | Discharge: 2015-01-28 | Disposition: A | Discharge status: Home / Self Care | Payer: No Typology Code available for payment source | Source: Ambulatory Visit | Attending: Obstetrics & Gynecology | Admitting: Obstetrics & Gynecology

## 2015-01-28 DIAGNOSIS — O09522 Supervision of elderly multigravida, second trimester: Secondary | ICD-10-CM

## 2015-01-28 DIAGNOSIS — M3500 Sicca syndrome, unspecified: Secondary | ICD-10-CM

## 2015-01-28 DIAGNOSIS — Z3A18 18 weeks gestation of pregnancy: Secondary | ICD-10-CM | POA: Insufficient documentation

## 2015-01-28 DIAGNOSIS — O9989 Other specified diseases and conditions complicating pregnancy, childbirth and the puerperium: Secondary | ICD-10-CM | POA: Insufficient documentation

## 2015-01-28 NOTE — Consult Note (Signed)
Maternal Fetal Medicine Consultation  Requesting Provider(s): Emeterio Reeve, MD  Reason for consultation: Hx of Sjogren's syndrome  HPI: Jillian Macias is a 39 yo G2P0010, EDD 06/28/2015 who is currently at 34w3dseen for consultation due to a history of Sjogren's syndrome.  [place]  She reports that she does not have any systemic manifestations - disease limited to dry eyes, mucous membranes and skin.  She denies any history of arthritis or arthralgia, skin rashes, photosensitivity or renal compromise.  She reports that her ESR is elevated and that she has elevated SSA and SSB antibodies (no records available for review).  Ms. Jillian Pollackis scheduled to be seen by a local Rheumatologist over the next several weeks. Her symptoms are well controlled on Plaquenil.  She is without complaints today and her prenatal course has otherwise been uncomplicated.  See previous notes from [facility]- had cell free fetal DNA that returned low risk for fetal aneuploidy.  OB History: OB History    Gravida Para Term Preterm AB TAB SAB Ectopic Multiple Living   _0      PMH:  Past Medical History  Diagnosis Date  . Sjogren's syndrome     PSH:  Past Surgical History  Procedure Laterality Date  . No past surgeries     Meds:  Current Outpatient Prescriptions on File Prior to Encounter  Medication Sig Dispense Refill  . Hydroxychloroquine Sulfate (PLAQUENIL PO) Take 200 mg by mouth.     . Ketotifen Fumarate (THERA TEARS ALLERGY OP) Apply to eye.    . Prenatal Vit-Fe Fumarate-FA (PRENATAL VITAMIN PO) Take by mouth.     No current facility-administered medications on file prior to encounter.   Allergies: No Known Allergies    FH: Maternal grandmother with Breast CA.  Denies family history of birth defects or hereditary disorders.  Soc:  History   Social History  . Marital  Status: Single    Spouse Name: N/A  . Number of Children: N/A  . Years of Education: N/A   Occupational History  . Not on file.   Social History Main Topics  . Smoking status: Never Smoker   . Smokeless tobacco: Never Used  . Alcohol Use: No  . Drug Use: No  . Sexual Activity: Yes    Birth Control/ Protection: None   Other Topics Concern  . Not on file   Social History Narrative    Review of Systems: no vaginal bleeding or cramping/contractions, no LOF, no nausea/vomiting. All other systems reviewed and are negative.  PE:  116/65, 70, 134#  GEN: well-appearing female ABD: gravid, NT  Ultrasound: Single IUP at 130w3d Advanced maternal age - NIPS low risk for aneuploidy Hx of Sjogren's disease Normal fetal anatomic survey Anterior placenta without previa Normal amniotic fluid volume  A/P: 1) Single IUP at 18w 3d         2) Sjogren's disorder - stable, denies any systemic manifestations. Plaquenil is a relatively safe drug during pregnancy and is probably the treatment of choice in pregnancy.  We briefly discussed the potential risk of fetal heart block associated with SSA and SSB antibodies and the thought that elevated antibodies may potentially destroy the conduction pathways in the fetal heart.         3) Advanced maternal age - previously completed genetic counseling.  Cell free fetal  DNA low risk for fetal aneuploidy.  Recommendations: 1) Will draw SSA and SSB antibodies today 2) Rheumatology consultation (pending) 3) Fetal echo over the next 2-4 weeks pending results of SSA and SSB antibodies 4) Ultrasound for interval growth in 6-8 weeks.  If there is any evidence of lagging growth at that time, will being serial growth scans every 4-6 weeks.   Thank you for the opportunity to be a part of the care of [facility]. Please contact our office if we can be of further assistance.   I spent approximately 30 minutes with this patient with over 50% of time  spent in face-to-face counseling.  Benjaman Lobe, MD Maternal Fetal Medicine

## 2015-01-31 LAB — SJOGRENS SYNDROME-A EXTRACTABLE NUCLEAR ANTIBODY: SSA (Ro) (ENA) Antibody, IgG: >8

## 2015-01-31 LAB — SJOGRENS SYNDROME-B EXTRACTABLE NUCLEAR ANTIBODY: SSB (La) (ENA) Antibody, IgG: >8

## 2015-02-15 ENCOUNTER — Other Ambulatory Visit (HOSPITAL_COMMUNITY): Payer: Self-pay | Admitting: Obstetrics and Gynecology

## 2015-02-15 ENCOUNTER — Encounter (HOSPITAL_COMMUNITY): Payer: Self-pay | Admitting: Family Medicine

## 2015-02-17 ENCOUNTER — Ambulatory Visit (INDEPENDENT_AMBULATORY_CARE_PROVIDER_SITE_OTHER): Payer: Self-pay | Admitting: Family Medicine

## 2015-02-17 VITALS — BP 107/78 | HR 90 | Temp 98.1°F | Wt 138.3 lb

## 2015-02-17 DIAGNOSIS — R760 Raised antibody titer: Secondary | ICD-10-CM

## 2015-02-17 DIAGNOSIS — R768 Other specified abnormal immunological findings in serum: Secondary | ICD-10-CM | POA: Insufficient documentation

## 2015-02-17 DIAGNOSIS — O0990 Supervision of high risk pregnancy, unspecified, unspecified trimester: Secondary | ICD-10-CM

## 2015-02-17 LAB — POCT URINALYSIS DIP (DEVICE)
BILIRUBIN URINE: NEGATIVE
Glucose, UA: NEGATIVE mg/dL
HGB URINE DIPSTICK: NEGATIVE
KETONES UR: NEGATIVE mg/dL
Leukocytes, UA: NEGATIVE
Nitrite: NEGATIVE
PH: 5.5 (ref 5.0–8.0)
Protein, ur: NEGATIVE mg/dL
Specific Gravity, Urine: <=1.005 (ref 1.005–1.030)
Urobilinogen, UA: 0.2 mg/dL (ref 0.0–1.0)

## 2015-02-17 NOTE — Progress Notes (Signed)
Patient is 39 y.o. G2P0010 [redacted]w[redacted]d.  +FM, denies LOF, VB, contractions, vaginal discharge.  Overall feeling well. # anti-SSA & anti SSA antibodies, normal fetal ECHO => needs biweekly fetal ECHO with interval FHT # sore throat last week resolved, but has residual runny nose x 1 week => no fevers, no sinus TTP, advised no abx unless febrile/sx worsen or last >2weeks # back pain and difficulty sleeping => sleep hygiene discussed, tylenol prn, declined flexeril

## 2015-02-24 ENCOUNTER — Ambulatory Visit (INDEPENDENT_AMBULATORY_CARE_PROVIDER_SITE_OTHER): Payer: Self-pay | Admitting: Family Medicine

## 2015-02-24 VITALS — BP 120/55 | HR 85 | Temp 98.2°F | Wt 140.9 lb

## 2015-02-24 DIAGNOSIS — O0990 Supervision of high risk pregnancy, unspecified, unspecified trimester: Secondary | ICD-10-CM

## 2015-02-24 DIAGNOSIS — O09522 Supervision of elderly multigravida, second trimester: Secondary | ICD-10-CM

## 2015-02-24 DIAGNOSIS — M3500 Sicca syndrome, unspecified: Secondary | ICD-10-CM

## 2015-02-24 DIAGNOSIS — O0992 Supervision of high risk pregnancy, unspecified, second trimester: Secondary | ICD-10-CM

## 2015-02-24 NOTE — Progress Notes (Signed)
Pt is here for biweekly FHT Pt is concerned about a mosquito this past week Pt is having issues with her sinuses.

## 2015-02-24 NOTE — Progress Notes (Signed)
Here for FHT dut to sjogren's.  No other concerns.

## 2015-03-02 ENCOUNTER — Ambulatory Visit: Payer: Self-pay | Admitting: General Practice

## 2015-03-02 VITALS — BP 114/42 | HR 83 | Temp 97.7°F | Ht 64.0 in | Wt 141.2 lb

## 2015-03-02 DIAGNOSIS — Z3492 Encounter for supervision of normal pregnancy, unspecified, second trimester: Secondary | ICD-10-CM

## 2015-03-02 NOTE — Progress Notes (Signed)
Patient here for FHR tones. Patient reports no problems. FHR 135. Patient has appt to come back next week.

## 2015-03-10 ENCOUNTER — Ambulatory Visit (INDEPENDENT_AMBULATORY_CARE_PROVIDER_SITE_OTHER): Payer: 59 | Admitting: Family Medicine

## 2015-03-10 VITALS — BP 122/78 | HR 86 | Temp 97.8°F | Wt 143.1 lb

## 2015-03-10 DIAGNOSIS — Z3492 Encounter for supervision of normal pregnancy, unspecified, second trimester: Secondary | ICD-10-CM

## 2015-03-10 DIAGNOSIS — M3500 Sicca syndrome, unspecified: Secondary | ICD-10-CM

## 2015-03-10 LAB — POCT URINALYSIS DIP (DEVICE)
Bilirubin Urine: NEGATIVE
Glucose, UA: NEGATIVE mg/dL
Hgb urine dipstick: NEGATIVE
Ketones, ur: NEGATIVE mg/dL
LEUKOCYTES UA: NEGATIVE
NITRITE: NEGATIVE
PROTEIN: NEGATIVE mg/dL
Specific Gravity, Urine: <=1.005 (ref 1.005–1.030)
Urobilinogen, UA: 0.2 mg/dL (ref 0.0–1.0)
pH: 5.5 (ref 5.0–8.0)

## 2015-03-10 NOTE — Progress Notes (Signed)
Patient is 39 y.o. G2P0010 609w2d.  +FM, denies LOF, VB, contractions, vaginal discharge.  Overall feeling well. # anti-SSA and anti SSA antibodies: has not had an ECHO since first, had problems with insurance so was unable to get 2nd ECHO, however now has sono scheduled; also has follow up with MFM # dry eyes: prn cold compresses, OTC saline drops, referred to ophtho

## 2015-03-10 NOTE — Progress Notes (Signed)
Pt has questions about using weights for exercise and back aches at night.

## 2015-03-10 NOTE — Progress Notes (Signed)
[facility], pt prefers to call on own to schedule appt due to work schedule.

## 2015-03-17 ENCOUNTER — Encounter (HOSPITAL_COMMUNITY): Payer: Self-pay | Admitting: Obstetrics and Gynecology

## 2015-03-24 ENCOUNTER — Ambulatory Visit: Payer: 59 | Admitting: *Deleted

## 2015-03-24 DIAGNOSIS — Z3492 Encounter for supervision of normal pregnancy, unspecified, second trimester: Secondary | ICD-10-CM

## 2015-03-24 NOTE — Progress Notes (Signed)
FHT: 151,  Pt has no complaints

## 2015-03-25 ENCOUNTER — Other Ambulatory Visit (HOSPITAL_COMMUNITY): Payer: Self-pay | Admitting: Maternal and Fetal Medicine

## 2015-03-25 ENCOUNTER — Ambulatory Visit (HOSPITAL_COMMUNITY)
Admission: RE | Admit: 2015-03-25 | Discharge: 2015-03-25 | Disposition: A | Discharge status: Home / Self Care | Payer: 59 | Source: Ambulatory Visit | Attending: Family Medicine | Admitting: Family Medicine

## 2015-03-25 DIAGNOSIS — O09522 Supervision of elderly multigravida, second trimester: Secondary | ICD-10-CM

## 2015-03-25 DIAGNOSIS — O9989 Other specified diseases and conditions complicating pregnancy, childbirth and the puerperium: Secondary | ICD-10-CM | POA: Diagnosis not present

## 2015-03-25 DIAGNOSIS — M3500 Sicca syndrome, unspecified: Secondary | ICD-10-CM

## 2015-03-25 DIAGNOSIS — Z3A00 Weeks of gestation of pregnancy not specified: Secondary | ICD-10-CM | POA: Diagnosis not present

## 2015-03-25 IMAGING — US US OB FOLLOW-UP
1 series · 12 of 28 positions shown · non-contrast
Comparison: none

[Series 1: us ob follow-up · 45 acquisitions, 12 frames shown]
[im 2/45]
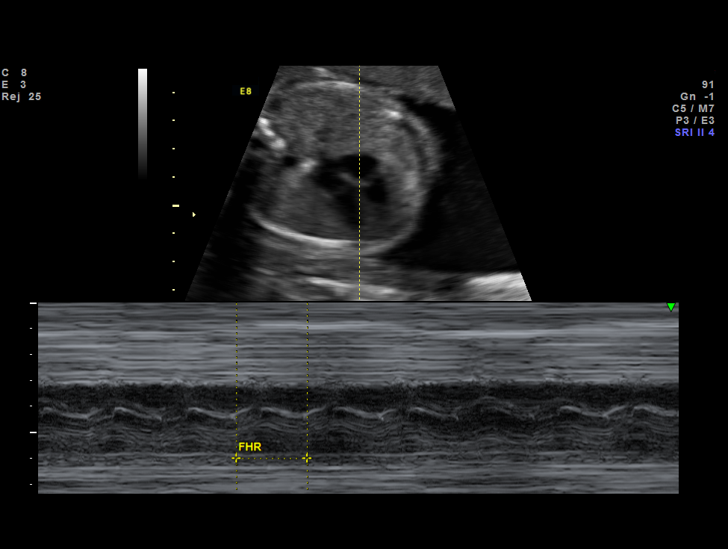
[im 5/45]
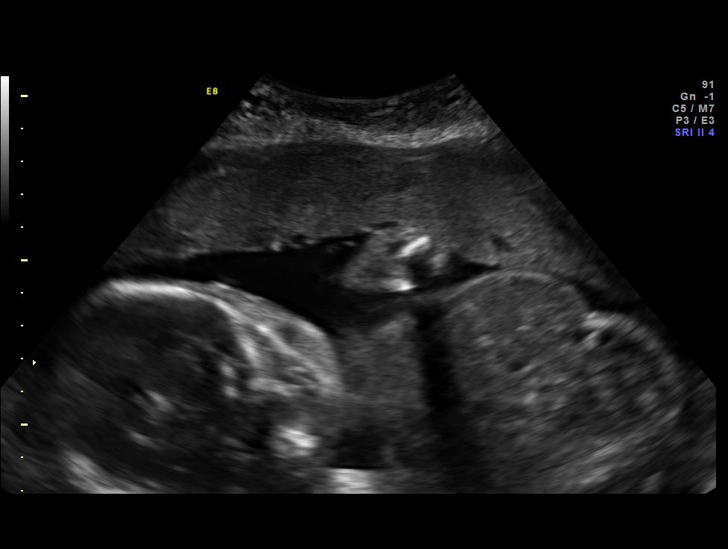
[im 9/45]
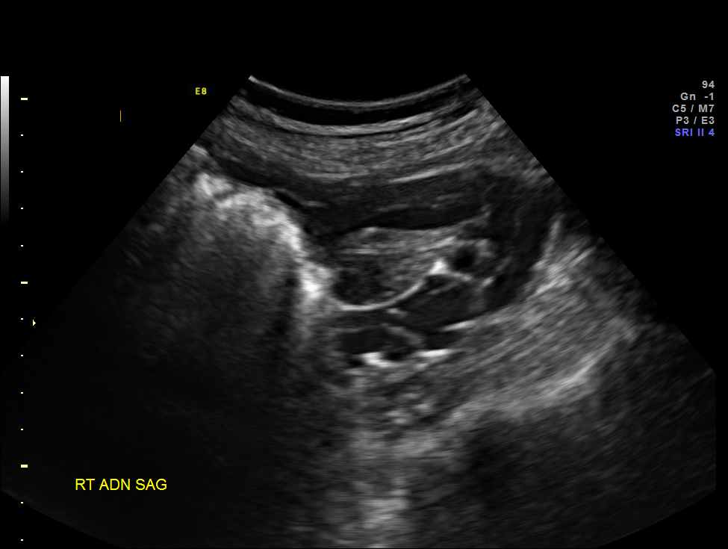
[im 14/45]
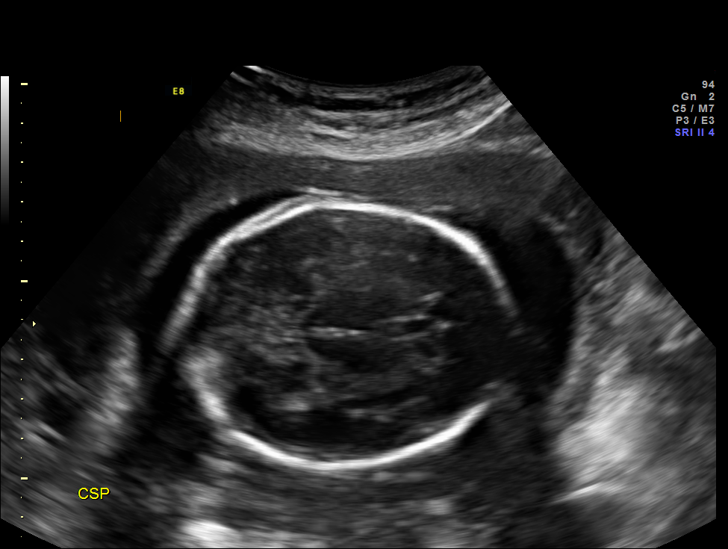
[im 17/45]
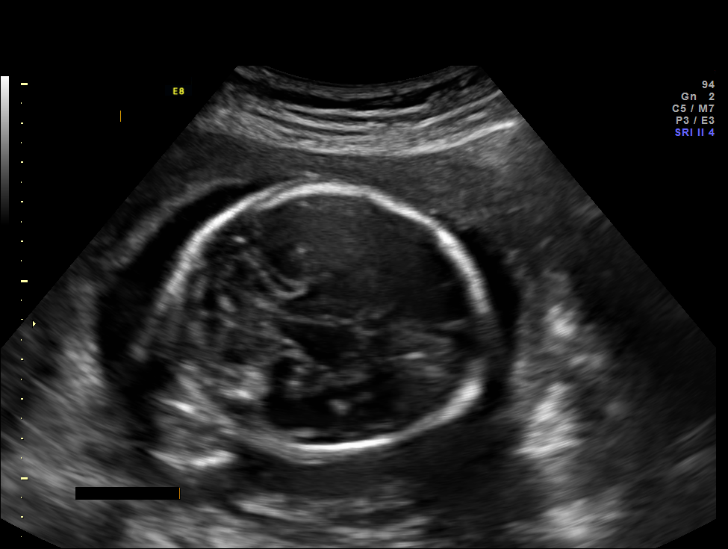
[im 20/45]
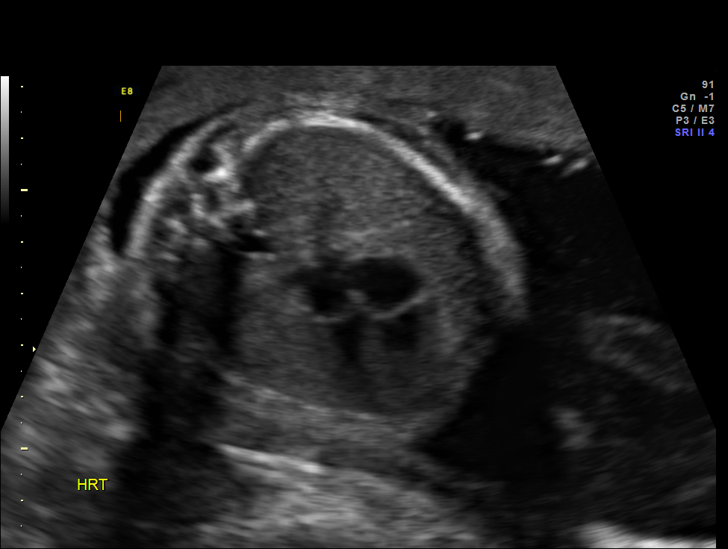
[im 25/45]
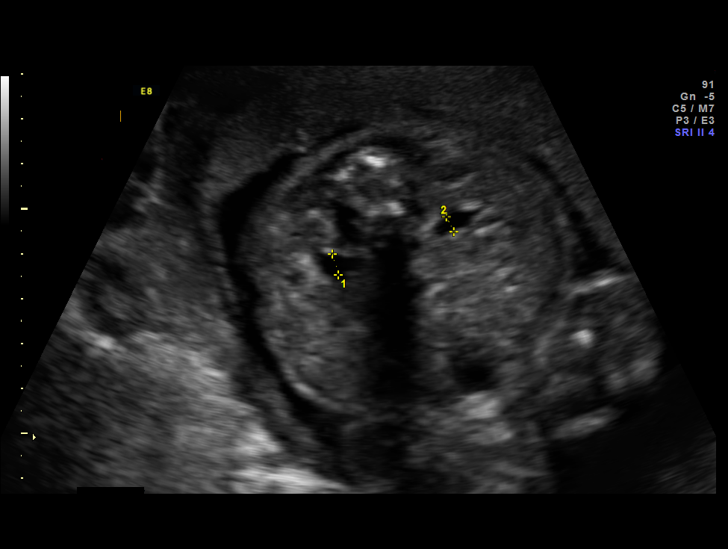
[im 28/45]
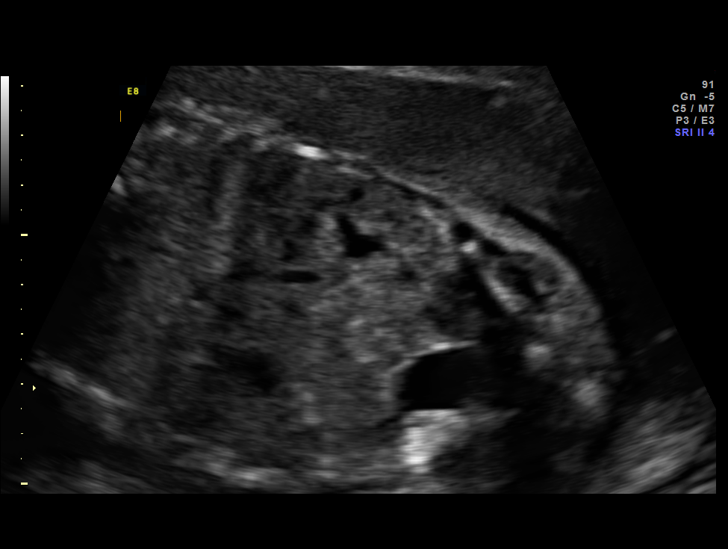
[im 31/45]
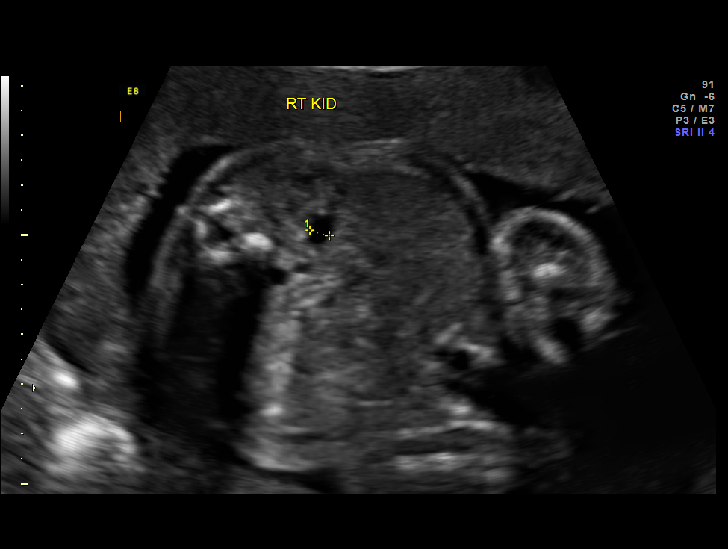
[im 36/45]
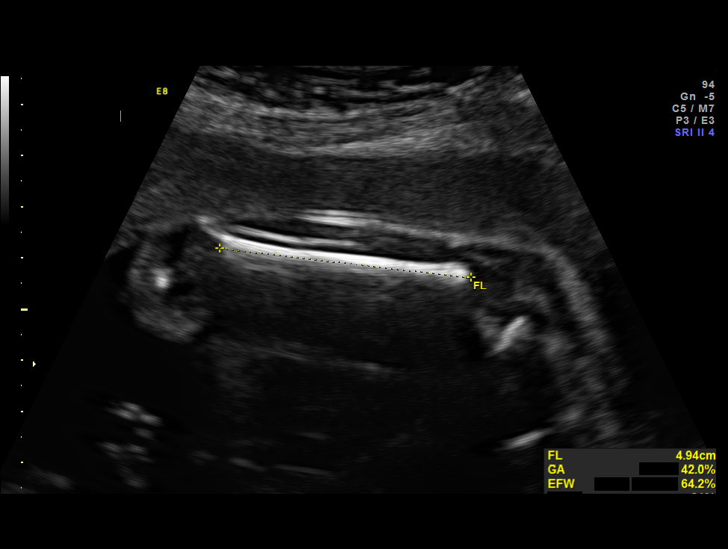
[im 40/45]
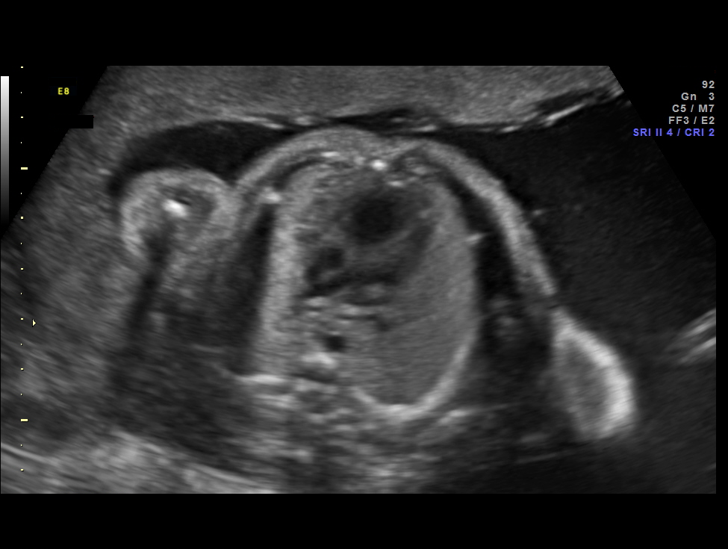
[im 43/45]
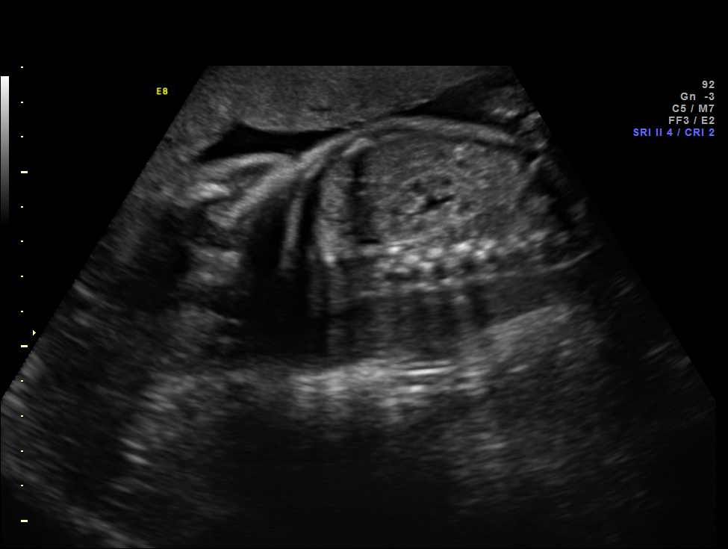

[12 of 28 positions shown; findings below may reference images not displayed]

OBSTETRICS REPORT
(Signed Final 03/25/2015 [DATE])

Service(s) Provided

US OB FOLLOW UP                                       76816.1
Indications

26 weeks gestation of pregnancy
Medical complication of pregnancy (Noqwin
Syndrome); quiescent on Plaquenil; + SS-A and
SS-B antibodies; serial ECHOs and Joseph
Advanced maternal age multigravida (39), second
trimester - low risk NIPS
Fetal Evaluation

Num Of Fetuses:    1
Fetal Heart Rate:  142                          bpm
Cardiac Activity:  Observed
Presentation:      Frank breech
Placenta:          Anterior, above cervical os
P. Cord            Previously Visualized
Insertion:

Amniotic Fluid
AFI FV:      Subjectively within normal limits
Larg Pckt:     6.1  cm
Biometry

BPD:     66.8  mm     G. Age:  26w 6d                CI:         75.9   70 - 86
OFD:       88  mm                                    FL/HC:      19.9   18.6 -
20.4
HC:       248  mm     G. Age:  26w 6d       41  %    HC/AC:      1.06   1.04 -
1.22
AC:     234.6  mm     G. Age:  27w 5d       80  %    FL/BPD:     73.8   71 - 87
FL:      49.3  mm     G. Age:  26w 4d       42  %    FL/AC:      21.0   20 - 24
HUM:     45.8  mm     G. Age:  27w 0d       61  %
CER:     32.5  mm     G. Age:  28w 4d       83  %

Est. FW:    1326  gm      2 lb 5 oz     70  %
Gestational Age

LMP:           26w 3d        Date:  09/21/14                 EDD:   06/28/15
U/S Today:     27w 0d                                        EDD:   06/24/15
Best:          26w 3d     Det. By:  LMP  (09/21/14)          EDD:   06/28/15
Anatomy

Cranium:          Appears normal         Aortic Arch:      Previously seen
Fetal Cavum:      Appears normal         Ductal Arch:      Previously seen
Ventricles:       Appears normal         Diaphragm:        Previously seen
Choroid Plexus:   Previously seen        Stomach:          Appears normal, left
sided
Cerebellum:       Appears normal         Abdomen:          Appears normal
Posterior Fossa:  Appears normal         Abdominal Wall:   Previously seen
Nuchal Fold:      Previously seen        Cord Vessels:     Previously seen
Face:             Orbits and profile     Kidneys:          Bilateral UTD
previously seen
Lips:             Previously seen        Bladder:          Appears normal
Heart:            Appears normal         Spine:            Previously seen
(4CH, axis, and
situs)
RVOT:             Previously seen        Lower             Previously seen
Extremities:
LVOT:             Previously seen        Upper             Previously seen
Extremities:

Other:  Female gender previously seen. Heels and nasal bone previously
visualized. Technically difficult due to fetal position.
Targeted Anatomy

Fetal Central Nervous System
Cisterna Magna:
Cervix Uterus Adnexa

Cervical Length:    3        cm

Cervix:       Normal appearance by transabdominal scan.
Left Ovary:    Not visualized.
Right Ovary:   Within normal limits.
Adnexa:     No abnormality visualized.
Impression

SIUP at 26+3 weeks
Normal interval anatomy; anatomic survey complete; mild,
bilateral pyelectasis
Normal amniotic fluid volume
Appropriate interval growth with EFW at the 70th %tile
Recommendations

Follow-up ultrasound for growth in 4 weeks
Continue ECHOs as per Dr. Mellad

questions or concerns.

## 2015-03-28 ENCOUNTER — Other Ambulatory Visit (HOSPITAL_COMMUNITY): Payer: Self-pay | Admitting: Maternal and Fetal Medicine

## 2015-04-01 ENCOUNTER — Other Ambulatory Visit (HOSPITAL_COMMUNITY): Payer: Self-pay | Admitting: Obstetrics and Gynecology

## 2015-04-07 ENCOUNTER — Encounter: Payer: Self-pay | Admitting: Family Medicine

## 2015-04-07 ENCOUNTER — Encounter: Payer: Self-pay | Admitting: Obstetrics & Gynecology

## 2015-04-07 ENCOUNTER — Ambulatory Visit (INDEPENDENT_AMBULATORY_CARE_PROVIDER_SITE_OTHER): Payer: 59 | Admitting: Family Medicine

## 2015-04-07 VITALS — BP 110/73 | HR 96 | Wt 147.9 lb

## 2015-04-07 DIAGNOSIS — Z23 Encounter for immunization: Secondary | ICD-10-CM

## 2015-04-07 DIAGNOSIS — M549 Dorsalgia, unspecified: Secondary | ICD-10-CM

## 2015-04-07 DIAGNOSIS — O26893 Other specified pregnancy related conditions, third trimester: Secondary | ICD-10-CM

## 2015-04-07 DIAGNOSIS — O0993 Supervision of high risk pregnancy, unspecified, third trimester: Secondary | ICD-10-CM

## 2015-04-07 DIAGNOSIS — O9989 Other specified diseases and conditions complicating pregnancy, childbirth and the puerperium: Secondary | ICD-10-CM

## 2015-04-07 DIAGNOSIS — O0990 Supervision of high risk pregnancy, unspecified, unspecified trimester: Secondary | ICD-10-CM

## 2015-04-07 LAB — CBC
HEMATOCRIT: 34.3 % — AB (ref 36.0–46.0)
HEMOGLOBIN: 11.2 g/dL — AB (ref 12.0–15.0)
MCH: 28.5 pg (ref 26.0–34.0)
MCHC: 32.7 g/dL (ref 30.0–36.0)
MCV: 87.3 fL (ref 78.0–100.0)
MPV: 9.3 fL (ref 8.6–12.4)
Platelets: 211 10*3/uL (ref 150–400)
RBC: 3.93 MIL/uL (ref 3.87–5.11)
RDW: 14.2 % (ref 11.5–15.5)
WBC: 6.9 10*3/uL (ref 4.0–10.5)

## 2015-04-07 LAB — POCT URINALYSIS DIP (DEVICE)
Bilirubin Urine: NEGATIVE
Glucose, UA: NEGATIVE mg/dL
HGB URINE DIPSTICK: NEGATIVE
KETONES UR: NEGATIVE mg/dL
LEUKOCYTES UA: NEGATIVE
Nitrite: NEGATIVE
PH: 6 (ref 5.0–8.0)
Protein, ur: NEGATIVE mg/dL
Specific Gravity, Urine: <=1.005 (ref 1.005–1.030)
Urobilinogen, UA: 0.2 mg/dL (ref 0.0–1.0)

## 2015-04-07 MED ORDER — CYCLOBENZAPRINE HCL 5 MG PO TABS: 5.0000 mg | ORAL_TABLET | Freq: Three times a day (TID) | ORAL | Status: DC | PRN

## 2015-04-07 MED ORDER — TETANUS-DIPHTH-ACELL PERTUSSIS 5-2.5-18.5 LF-MCG/0.5 IM SUSP
0.5000 mL | Freq: Once | INTRAMUSCULAR | Status: AC
  Administered 2015-04-07: 0.5 mL via INTRAMUSCULAR

## 2015-04-07 NOTE — Patient Instructions (Signed)
10 kicks in 12 hours Or 10 kicks in 2 hours of focused baby time   Fetal Movement Counts Patient Name: __________________________________________________ Patient Due Date: ____________________ Performing a fetal movement count is highly recommended in high-risk pregnancies, but it is good for every pregnant woman to do. Your health care provider may ask you to start counting fetal movements at 28 weeks of the pregnancy. Fetal movements often increase:  After eating a full meal.  After physical activity.  After eating or drinking something sweet or cold.  At rest. Pay attention to when you feel the baby is most active. This will help you notice a pattern of your baby's sleep and wake cycles and what factors contribute to an increase in fetal movement. It is important to perform a fetal movement count at the same time each day when your baby is normally most active.  HOW TO COUNT FETAL MOVEMENTS 1. Find a quiet and comfortable area to sit or lie down on your left side. Lying on your left side provides the best blood and oxygen circulation to your baby. 2. Write down the day and time on a sheet of paper or in a journal. 3. Start counting kicks, flutters, swishes, rolls, or jabs in a 2-hour period. You should feel at least 10 movements within 2 hours. 4. If you do not feel 10 movements in 2 hours, wait 2-3 hours and count again. Look for a change in the pattern or not enough counts in 2 hours. SEEK MEDICAL CARE IF:  You feel less than 10 counts in 2 hours, tried twice.  There is no movement in over an hour.  The pattern is changing or taking longer each day to reach 10 counts in 2 hours.  You feel the baby is not moving as he or she usually does. Date: ____________ Movements: ____________ Start time: ____________ Doreatha MartinFinish time: ____________  Date: ____________ Movements: ____________ Start time: ____________ Doreatha MartinFinish time: ____________ Date: ____________ Movements: ____________ Start time:  ____________ Doreatha MartinFinish time: ____________ Date: ____________ Movements: ____________ Start time: ____________ Doreatha MartinFinish time: ____________ Date: ____________ Movements: ____________ Start time: ____________ Doreatha MartinFinish time: ____________ Date: ____________ Movements: ____________ Start time: ____________ Doreatha MartinFinish time: ____________ Date: ____________ Movements: ____________ Start time: ____________ Doreatha MartinFinish time: ____________ Date: ____________ Movements: ____________ Start time: ____________ Doreatha MartinFinish time: ____________  Date: ____________ Movements: ____________ Start time: ____________ Doreatha MartinFinish time: ____________ Date: ____________ Movements: ____________ Start time: ____________ Doreatha MartinFinish time: ____________ Date: ____________ Movements: ____________ Start time: ____________ Doreatha MartinFinish time: ____________ Date: ____________ Movements: ____________ Start time: ____________ Doreatha MartinFinish time: ____________ Date: ____________ Movements: ____________ Start time: ____________ Doreatha MartinFinish time: ____________ Date: ____________ Movements: ____________ Start time: ____________ Doreatha MartinFinish time: ____________ Date: ____________ Movements: ____________ Start time: ____________ Doreatha MartinFinish time: ____________  Date: ____________ Movements: ____________ Start time: ____________ Doreatha MartinFinish time: ____________ Date: ____________ Movements: ____________ Start time: ____________ Doreatha MartinFinish time: ____________ Date: ____________ Movements: ____________ Start time: ____________ Doreatha MartinFinish time: ____________ Date: ____________ Movements: ____________ Start time: ____________ Doreatha MartinFinish time: ____________ Date: ____________ Movements: ____________ Start time: ____________ Doreatha MartinFinish time: ____________ Date: ____________ Movements: ____________ Start time: ____________ Doreatha MartinFinish time: ____________ Date: ____________ Movements: ____________ Start time: ____________ Doreatha MartinFinish time: ____________  Date: ____________ Movements: ____________ Start time: ____________ Doreatha MartinFinish time: ____________ Date:  ____________ Movements: ____________ Start time: ____________ Doreatha MartinFinish time: ____________ Date: ____________ Movements: ____________ Start time: ____________ Doreatha MartinFinish time: ____________ Date: ____________ Movements: ____________ Start time: ____________ Doreatha MartinFinish time: ____________ Date: ____________ Movements: ____________ Start time: ____________ Doreatha MartinFinish time: ____________ Date: ____________ Movements: ____________ Start time: ____________ Doreatha MartinFinish time: ____________ Date: ____________  Movements: ____________ Start time: ____________ Doreatha Martin time: ____________  Date: ____________ Movements: ____________ Start time: ____________ Doreatha Martin time: ____________ Date: ____________ Movements: ____________ Start time: ____________ Doreatha Martin time: ____________ Date: ____________ Movements: ____________ Start time: ____________ Doreatha Martin time: ____________ Date: ____________ Movements: ____________ Start time: ____________ Doreatha Martin time: ____________ Date: ____________ Movements: ____________ Start time: ____________ Doreatha Martin time: ____________ Date: ____________ Movements: ____________ Start time: ____________ Doreatha Martin time: ____________ Date: ____________ Movements: ____________ Start time: ____________ Doreatha Martin time: ____________  Date: ____________ Movements: ____________ Start time: ____________ Doreatha Martin time: ____________ Date: ____________ Movements: ____________ Start time: ____________ Doreatha Martin time: ____________ Date: ____________ Movements: ____________ Start time: ____________ Doreatha Martin time: ____________ Date: ____________ Movements: ____________ Start time: ____________ Doreatha Martin time: ____________ Date: ____________ Movements: ____________ Start time: ____________ Doreatha Martin time: ____________ Date: ____________ Movements: ____________ Start time: ____________ Doreatha Martin time: ____________ Date: ____________ Movements: ____________ Start time: ____________ Doreatha Martin time: ____________  Date: ____________ Movements: ____________ Start  time: ____________ Doreatha Martin time: ____________ Date: ____________ Movements: ____________ Start time: ____________ Doreatha Martin time: ____________ Date: ____________ Movements: ____________ Start time: ____________ Doreatha Martin time: ____________ Date: ____________ Movements: ____________ Start time: ____________ Doreatha Martin time: ____________ Date: ____________ Movements: ____________ Start time: ____________ Doreatha Martin time: ____________ Date: ____________ Movements: ____________ Start time: ____________ Doreatha Martin time: ____________ Date: ____________ Movements: ____________ Start time: ____________ Doreatha Martin time: ____________  Date: ____________ Movements: ____________ Start time: ____________ Doreatha Martin time: ____________ Date: ____________ Movements: ____________ Start time: ____________ Doreatha Martin time: ____________ Date: ____________ Movements: ____________ Start time: ____________ Doreatha Martin time: ____________ Date: ____________ Movements: ____________ Start time: ____________ Doreatha Martin time: ____________ Date: ____________ Movements: ____________ Start time: ____________ Doreatha Martin time: ____________ Date: ____________ Movements: ____________ Start time: ____________ Doreatha Martin time: ____________ Document Released: 11/28/2006 Document Revised: 03/15/2014 Document Reviewed: 08/25/2012 [employer] Patient Information 2015 [employer]. This information is not intended to replace advice given to you by your health care provider. Make sure you discuss any questions you have with your health care provider.

## 2015-04-07 NOTE — Progress Notes (Signed)
Patient reports lower back pain  

## 2015-04-07 NOTE — Addendum Note (Signed)
Addended by: Aldona LentoFISHER, Giovannina Mun L on: 04/07/2015 09:25 AM   Modules accepted: Orders

## 2015-04-07 NOTE — Progress Notes (Signed)
Patient is 39 y.o. G2P0010 4557w2d.  +FM, denies LOF, VB, contractions, some vaginal discharge.  Overall feeling well. - has back pain at night - reports has one more ECHO for next week - declines RPR and HIV, do not have HIV on file, does not want to have done as she reports having had it done before.  Declined HIV at HD during initial visit

## 2015-04-08 ENCOUNTER — Inpatient Hospital Stay (HOSPITAL_COMMUNITY)
Admission: EM | Admit: 2015-04-08 | Discharge: 2015-04-08 | Disposition: A | Discharge status: Home / Self Care | Payer: 59 | Source: Ambulatory Visit | Attending: Family Medicine | Admitting: Family Medicine

## 2015-04-08 DIAGNOSIS — R42 Dizziness and giddiness: Secondary | ICD-10-CM | POA: Diagnosis present

## 2015-04-08 DIAGNOSIS — Z79899 Other long term (current) drug therapy: Secondary | ICD-10-CM | POA: Diagnosis not present

## 2015-04-08 DIAGNOSIS — R109 Unspecified abdominal pain: Secondary | ICD-10-CM | POA: Insufficient documentation

## 2015-04-08 DIAGNOSIS — M3500 Sicca syndrome, unspecified: Secondary | ICD-10-CM | POA: Diagnosis not present

## 2015-04-08 DIAGNOSIS — O9989 Other specified diseases and conditions complicating pregnancy, childbirth and the puerperium: Secondary | ICD-10-CM | POA: Insufficient documentation

## 2015-04-08 DIAGNOSIS — Z3A28 28 weeks gestation of pregnancy: Secondary | ICD-10-CM | POA: Diagnosis not present

## 2015-04-08 DIAGNOSIS — R55 Syncope and collapse: Secondary | ICD-10-CM | POA: Diagnosis not present

## 2015-04-08 LAB — GLUCOSE TOLERANCE, 1 HOUR (50G) W/O FASTING: GLUCOSE 1 HOUR GTT: 86 mg/dL (ref 70–140)

## 2015-04-08 LAB — URINALYSIS, ROUTINE W REFLEX MICROSCOPIC
Bilirubin Urine: NEGATIVE
Glucose, UA: NEGATIVE mg/dL
Hgb urine dipstick: NEGATIVE
Ketones, ur: NEGATIVE mg/dL
Leukocytes, UA: NEGATIVE
Nitrite: NEGATIVE
PH: 7 (ref 5.0–8.0)
Protein, ur: NEGATIVE mg/dL
SPECIFIC GRAVITY, URINE: 1.01 (ref 1.005–1.030)
Urobilinogen, UA: 0.2 mg/dL (ref 0.0–1.0)

## 2015-04-08 NOTE — MAU Note (Signed)
Pt received Tdap vaccine yesterday, started vomiting last night, thought it was because of the injection.  Felt ok this morning, but then became dizzy, almost fainted, became diaphoretic.  Feels constant cramp in LUQ.  Denies bleeding or LOF.

## 2015-04-08 NOTE — Discharge Instructions (Signed)
Vasovagal syncope -- One of the most common types of syncope is called vasovagal syncope or neurocardiogenic syncope. A variety of conditions can trigger vasovagal syncope, including physical or psychological stress, dehydration, bleeding, or pain. The heart rate slows dramatically and the blood vessels in the body expand, causing blood to pool in the legs, resulting in low blood pressure (hypotension). This causes a decrease in blood flow to the brain. In some cases, vasovagal syncope is triggered by an emotional response to a stimulus, such as fear of injury, heat exposure, the sight of blood, or extreme pain. In other cases, it is caused by abnormal nervous system responses to activities such as urinating, having a bowel movement, coughing, or swallowing. In still other cases, no trigger can be identified. In most cases of vasovagal syncope, you have some warning that you are near fainting. These signs include dizziness, nausea, pale skin, "tunnel-like" vision, and profuse sweating. After the episode, symptoms may continue because of continued low blood pressure. Some people feel extremely tired.   Vasovagal Syncope, Adult Syncope, commonly known as fainting, is a temporary loss of consciousness. It occurs when the blood flow to the brain is reduced. Vasovagal syncope (also called neurocardiogenic syncope) is a fainting spell in which the blood flow to the brain is reduced because of a sudden drop in heart rate and blood pressure. Vasovagal syncope occurs when the brain and the cardiovascular system (blood vessels) do not adequately communicate and respond to each other. This is the most common cause of fainting. It often occurs in response to fear or some other type of emotional or physical stress. The body has a reaction in which the heart starts beating too slowly or the blood vessels expand, reducing blood pressure. This type of fainting spell is generally considered harmless. However, injuries can  occur if a person takes a sudden fall during a fainting spell.  CAUSES  Vasovagal syncope occurs when a person's blood pressure and heart rate decrease suddenly, usually in response to a trigger. Many things and situations can trigger an episode. Some of these include:   Pain.   Fear.   The sight of blood or medical procedures, such as blood being drawn from a vein.   Common activities, such as coughing, swallowing, stretching, or going to the bathroom.   Emotional stress.   Prolonged standing, especially in a warm environment.   Lack of sleep or rest.   Prolonged lack of food.   Prolonged lack of fluids.   Recent illness.  The use of certain drugs that affect blood pressure, such as cocaine, alcohol, marijuana, inhalants, and opiates.  SYMPTOMS  Before the fainting episode, you may:   Feel dizzy or light headed.   Become pale.  Sense that you are going to faint.   Feel like the room is spinning.   Have tunnel vision, only seeing directly in front of you.   Feel sick to your stomach (nauseous).   See spots or slowly lose vision.   Hear ringing in your ears.   Have a headache.   Feel warm and sweaty.   Feel a sensation of pins and needles. During the fainting spell, you will generally be unconscious for no longer than a couple minutes before waking up and returning to normal. If you get up too quickly before your body can recover, you may faint again. Some twitching or jerky movements may occur during the fainting spell.  DIAGNOSIS  Your caregiver will ask about your symptoms, take  a medical history, and perform a physical exam. Various tests may be done to rule out other causes of fainting. These may include blood tests and tests to check the heart, such as electrocardiography, echocardiography, and possibly an electrophysiology study. When other causes have been ruled out, a test may be done to check the body's response to changes in position  (tilt table test). TREATMENT  Most cases of vasovagal syncope do not require treatment. Your caregiver may recommend ways to avoid fainting triggers and may provide home strategies for preventing fainting. If you must be exposed to a possible trigger, you can drink additional fluids to help reduce your chances of having an episode of vasovagal syncope. If you have warning signs of an oncoming episode, you can respond by positioning yourself favorably (lying down). If your fainting spells continue, you may be given medicines to prevent fainting. Some medicines may help make you more resistant to repeated episodes of vasovagal syncope. Special exercises or compression stockings may be recommended. In rare cases, the surgical placement of a pacemaker is considered. HOME CARE INSTRUCTIONS   Learn to identify the warning signs of vasovagal syncope.   Sit or lie down at the first warning sign of a fainting spell. If sitting, put your head down between your legs. If you lie down, swing your legs up in the air to increase blood flow to the brain.   Avoid hot tubs and saunas.  Avoid prolonged standing.  Drink enough fluids to keep your urine clear or pale yellow. Avoid caffeine.  Increase salt in your diet as directed by your caregiver.   If you have to stand for a long time, perform movements such as:   Crossing your legs.   Flexing and stretching your leg muscles.   Squatting.   Moving your legs.   Bending over.   Only take over-the-counter or prescription medicines as directed by your caregiver. Do not suddenly stop any medicines without asking your caregiver first. SEEK MEDICAL CARE IF:   Your fainting spells continue or happen more frequently in spite of treatment.   You lose consciousness for more than a couple minutes.  You have fainting spells during or after exercising or after being startled.   You have new symptoms that occur with the fainting spells, such as:    Shortness of breath.  Chest pain.   Irregular heartbeat.   You have episodes of twitching or jerky movements that last longer than a few seconds.  You have episodes of twitching or jerky movements without obvious fainting. SEEK IMMEDIATE MEDICAL CARE IF:   You have injuries or bleeding after a fainting spell.   You have episodes of twitching or jerky movements that last longer than 5 minutes.   You have more than one spell of twitching or jerky movements before returning to consciousness after fainting. MAKE SURE YOU:   Understand these instructions.  Will watch your condition.  Will get help right away if you are not doing well or get worse. Document Released: 10/15/2012 Document Reviewed: 10/15/2012 [facility] Patient Information 2015 [place], [place]. This information is not intended to replace advice given to you by your health care provider. Make sure you discuss any questions you have with your health care provider.

## 2015-04-08 NOTE — MAU Provider Note (Signed)
History     CSN: [id number]  Arrival date and time: 04/08/15 [id number]   None     Chief Complaint  Patient presents with  . Dizziness  . Abdominal Pain   HPI  Patient is 39 y.o. G2P0010 5560w3d here with complaints of dizziness, diarrhea x 1, hot flashes.  Pt had Tdap yesterday and is concerned symptoms due to Tdap.  Had some cramping/diarrhea x 1, subsequently felt dizzy and had hot flashes.  +FM, denies LOF, VB, contractions, vaginal discharge.     Past Medical History  Diagnosis Date  . Sjogren's syndrome     Past Surgical History  Procedure Laterality Date  . Breast surgery      Family History  Problem Relation Age of Onset  . Cancer Maternal Grandmother     History  Substance Use Topics  . Smoking status: Never Smoker   . Smokeless tobacco: Never Used  . Alcohol Use: No    Allergies: No Known Allergies  Prescriptions prior to admission  Medication Sig Dispense Refill Last Dose  . hydroxychloroquine (PLAQUENIL) 200 MG tablet Take 1 tablet (200 mg total) by mouth 2 (two) times daily. (Patient taking differently: Take 200 mg by mouth at bedtime. ) 60 tablet 1 04/07/2015 at Unknown time  . Ketotifen Fumarate (THERA TEARS ALLERGY OP) Apply 1 drop to eye 2 (two) times daily as needed (for dryness).   Past Week at Unknown time  . Prenatal Vit-Fe Fumarate-FA (PRENATAL MULTIVITAMIN) TABS tablet Take 1 tablet by mouth daily at 12 noon.   04/07/2015 at Unknown time  . cyclobenzaprine (FLEXERIL) 10 MG tablet To use PRN (Patient taking differently: Take 10 mg by mouth at bedtime as needed (for back pain). ) 10 tablet 0   . tobramycin (TOBREX) 0.3 % ophthalmic ointment Place 1 application into both eyes 3 (three) times daily. (Patient not taking: Reported on 01/10/2015) 3.5 g 0 Not Taking at Unknown time    Review of Systems  Constitutional: Positive for diaphoresis. Negative for fever and chills.  HENT: Negative for congestion.   Respiratory: Negative for cough and shortness  of breath.   Cardiovascular: Negative for chest pain and leg swelling.  Gastrointestinal: Positive for diarrhea (1 bowel mvmt). Negative for heartburn, nausea and vomiting.  Genitourinary: Negative for dysuria, urgency, frequency and hematuria.  Skin: Negative for itching and rash.  Neurological: Positive for dizziness. Negative for loss of consciousness, weakness and headaches.   Physical Exam   Blood pressure 109/75, pulse 84, temperature 98.1 F (36.7 C), temperature source Oral, resp. rate 18, last menstrual period 09/21/2014, SpO2 98 %.  Physical Exam  Constitutional: She is oriented to person, place, and time. She appears well-developed and well-nourished.  HENT:  Head: Normocephalic and atraumatic.  Eyes: Conjunctivae and EOM are normal.  Neck: Normal range of motion.  Cardiovascular: Normal rate.   Respiratory: Effort normal. No respiratory distress.  GI: Soft. Bowel sounds are normal. She exhibits no distension. There is no tenderness.  Musculoskeletal: Normal range of motion. She exhibits no edema.  Neurological: She is alert and oriented to person, place, and time.  Skin: Skin is warm and dry. No erythema.   Dilation: Closed Effacement (%): Thick Cervical Position: Posterior Exam by:: Dr. Loreta AveAcosta  MAU Course  Procedures  MDM NST reactive  Assessment and Plan  Patient is 39 y.o. G2P0010 6060w3d reporting dizziness likely secondary to vasovagal reaction to braxton hicks contractions - fetal kick counts reinforced - preterm labor precautions, no signs of preterm labor  at this time - pt very anxious, anxiety may play part in this as well.   Jillian Macias Jillian Macias 04/08/2015, 10:31 AM

## 2015-04-14 ENCOUNTER — Encounter (HOSPITAL_COMMUNITY): Payer: Self-pay | Admitting: Family Medicine

## 2015-04-21 ENCOUNTER — Encounter: Payer: 59 | Admitting: Obstetrics & Gynecology

## 2015-04-21 ENCOUNTER — Ambulatory Visit: Payer: 59

## 2015-04-22 ENCOUNTER — Ambulatory Visit: Payer: 59

## 2015-04-22 ENCOUNTER — Other Ambulatory Visit (HOSPITAL_COMMUNITY): Payer: Self-pay | Admitting: Maternal and Fetal Medicine

## 2015-04-22 ENCOUNTER — Ambulatory Visit (HOSPITAL_COMMUNITY)
Admission: RE | Admit: 2015-04-22 | Discharge: 2015-04-22 | Disposition: A | Discharge status: Home / Self Care | Payer: 59 | Source: Ambulatory Visit | Attending: Family Medicine | Admitting: Family Medicine

## 2015-04-22 DIAGNOSIS — I099 Rheumatic heart disease, unspecified: Secondary | ICD-10-CM | POA: Insufficient documentation

## 2015-04-22 DIAGNOSIS — O9989 Other specified diseases and conditions complicating pregnancy, childbirth and the puerperium: Secondary | ICD-10-CM | POA: Diagnosis present

## 2015-04-22 DIAGNOSIS — IMO0001 Reserved for inherently not codable concepts without codable children: Secondary | ICD-10-CM | POA: Insufficient documentation

## 2015-04-22 DIAGNOSIS — O09523 Supervision of elderly multigravida, third trimester: Secondary | ICD-10-CM | POA: Diagnosis not present

## 2015-04-22 DIAGNOSIS — O09522 Supervision of elderly multigravida, second trimester: Secondary | ICD-10-CM

## 2015-04-22 DIAGNOSIS — M3500 Sicca syndrome, unspecified: Secondary | ICD-10-CM | POA: Diagnosis not present

## 2015-04-22 DIAGNOSIS — Z3A30 30 weeks gestation of pregnancy: Secondary | ICD-10-CM | POA: Diagnosis not present

## 2015-04-22 DIAGNOSIS — O09519 Supervision of elderly primigravida, unspecified trimester: Secondary | ICD-10-CM | POA: Insufficient documentation

## 2015-04-27 ENCOUNTER — Telehealth: Payer: Self-pay | Admitting: *Deleted

## 2015-04-27 NOTE — Telephone Encounter (Signed)
Pt left message stating that she is having swelling of her feet, ankles, legs and face. She requested a call back.  I called pt and left message on her personal voice mail. I stated that we would like additional information regarding her swelling. Please call back and state how long she has had the problem, if it gets worse with physical activity and whether or not she has H/A, visual changes or dizziness. This information will help [place] to determine the level of concern as some swelling in pregnancy is normal.

## 2015-04-29 ENCOUNTER — Ambulatory Visit (INDEPENDENT_AMBULATORY_CARE_PROVIDER_SITE_OTHER): Payer: 59 | Admitting: Family Medicine

## 2015-04-29 VITALS — BP 95/69 | HR 88 | Temp 97.9°F | Wt 148.3 lb

## 2015-04-29 DIAGNOSIS — O0990 Supervision of high risk pregnancy, unspecified, unspecified trimester: Secondary | ICD-10-CM

## 2015-04-29 LAB — POCT URINALYSIS DIP (DEVICE)
Bilirubin Urine: NEGATIVE
GLUCOSE, UA: NEGATIVE mg/dL
HGB URINE DIPSTICK: NEGATIVE
Ketones, ur: NEGATIVE mg/dL
Leukocytes, UA: NEGATIVE
NITRITE: NEGATIVE
PH: 6.5 (ref 5.0–8.0)
Protein, ur: NEGATIVE mg/dL
SPECIFIC GRAVITY, URINE: 1.015 (ref 1.005–1.030)
Urobilinogen, UA: 0.2 mg/dL (ref 0.0–1.0)

## 2015-04-29 NOTE — Telephone Encounter (Signed)
Patient coming today for OB visit, will address concerns

## 2015-04-29 NOTE — Progress Notes (Signed)
Reports swelling of feet, ankles, and thighs and groin earlier this week that was concerning to her. Also reports redness of left eye earlier this week that she feels is related to her syndrome. Also reports feeling short of breathe this week

## 2015-04-29 NOTE — Progress Notes (Signed)
Subjective:  Jillian Macias is a 39 y.o. G2P0010 at [redacted]w[redacted]d being seen today for ongoing prenatal care.  Patient reports some thigh swelling, shortness of breath.  Contractions: Not present.  Vag. Bleeding: None. Movement: Present. Denies leaking of fluid.   The following portions of the patient's history were reviewed and updated as appropriate: allergies, current medications, past family history, past medical history, past social history, past surgical history and problem list.   Objective:   Filed Vitals:   04/29/15 0827  BP: 95/69  Pulse: 88  Temp: 97.9 F (36.6 C)  Weight: 148 lb 4.8 oz (67.268 kg)    Fetal Status: Fetal Heart Rate (bpm): 137   Movement: Present     General:  Alert, oriented and cooperative. Patient is in no acute distress.  Skin: Skin is warm and dry. No rash noted.   Cardiovascular: Normal heart rate noted  Respiratory: Effort and breath sounds normal, no problems with respiration noted, lungs clear  Abdomen: Soft, gravid, appropriate for gestational age. Pain/Pressure: Absent     Vaginal: Vag. Bleeding: None.    Vag D/C Character: White  Cervix: Not evaluated  Extremities: Normal range of motion.  Edema: Trace  Mental Status: Normal mood and affect. Normal behavior. Normal judgment and thought content.   Urinalysis: Urine Protein: Negative Urine Glucose: Negative  Assessment and Plan:  Pregnancy: G2P0010 at [redacted]w[redacted]d  There are no diagnoses linked to this encounter.   Preterm labor symptoms and general obstetric precautions including but not limited to vaginal bleeding, contractions, leaking of fluid and fetal movement were reviewed in detail with the patient. Thigh swelling: no swelling today, discussed compression of lymphatic system with likely resultant swelling.  DVT warnings discussed SOB: normal exam, discussed warning signs  Please refer to After Visit Summary for other counseling recommendations.   Return in about 2 weeks (around  05/13/2015).   Fredirick Lathe, MD

## 2015-05-05 ENCOUNTER — Ambulatory Visit (INDEPENDENT_AMBULATORY_CARE_PROVIDER_SITE_OTHER): Payer: 59 | Admitting: *Deleted

## 2015-05-05 DIAGNOSIS — Z3493 Encounter for supervision of normal pregnancy, unspecified, third trimester: Secondary | ICD-10-CM

## 2015-05-05 NOTE — Progress Notes (Signed)
Fetal Heart Tones 150. Pt feeling well, baby is moving a lot. No complaints.

## 2015-05-13 ENCOUNTER — Telehealth: Payer: Self-pay | Admitting: *Deleted

## 2015-05-13 NOTE — Telephone Encounter (Signed)
Spoke with patient via telephone.  States she is having some congestion and a cough.  Wants to know what she can take.  Told patient she may take claritin, tylenol allergy, allegra or zyrtec - not all 4 of these only 1 but these were some options.  Explained she could purchase these over-the-counter.  Patient states understanding.  States she was in [place] this week and returned yesterday.  States while she was there she was bitten by a mosquito on Tuesday 0628/16.  Asks if she should be concerned.  States she has researched on the [employer] website but wanted to check with us.  I told her she is probably just fine but to be sure she notifies the provider at her visit next week.  Patient states understanding.  I also encouraged patient to contact us on Tuesday if she is not feeling better so we can get her into the office sooner than Friday of next week.  Patient states understanding.

## 2015-05-13 NOTE — Telephone Encounter (Signed)
Received voicemail message from patient left today at 1001.  States she woke up "not feeling good" and wants to get some advice.

## 2015-05-16 ENCOUNTER — Inpatient Hospital Stay (HOSPITAL_COMMUNITY)
Admission: AD | Admit: 2015-05-16 | Discharge: 2015-05-16 | Disposition: A | Discharge status: Home / Self Care | Payer: 59 | Source: Ambulatory Visit | Attending: Obstetrics & Gynecology | Admitting: Obstetrics & Gynecology

## 2015-05-16 DIAGNOSIS — Z3A34 34 weeks gestation of pregnancy: Secondary | ICD-10-CM

## 2015-05-16 DIAGNOSIS — Z20828 Contact with and (suspected) exposure to other viral communicable diseases: Secondary | ICD-10-CM

## 2015-05-16 DIAGNOSIS — R509 Fever, unspecified: Secondary | ICD-10-CM | POA: Diagnosis present

## 2015-05-16 DIAGNOSIS — D899 Disorder involving the immune mechanism, unspecified: Secondary | ICD-10-CM

## 2015-05-16 DIAGNOSIS — O9989 Other specified diseases and conditions complicating pregnancy, childbirth and the puerperium: Secondary | ICD-10-CM | POA: Diagnosis not present

## 2015-05-16 DIAGNOSIS — R05 Cough: Secondary | ICD-10-CM | POA: Insufficient documentation

## 2015-05-16 DIAGNOSIS — Z3A33 33 weeks gestation of pregnancy: Secondary | ICD-10-CM | POA: Diagnosis not present

## 2015-05-16 DIAGNOSIS — M3500 Sicca syndrome, unspecified: Secondary | ICD-10-CM | POA: Diagnosis not present

## 2015-05-16 DIAGNOSIS — D849 Immunodeficiency, unspecified: Secondary | ICD-10-CM

## 2015-05-16 DIAGNOSIS — R0981 Nasal congestion: Secondary | ICD-10-CM | POA: Diagnosis not present

## 2015-05-16 DIAGNOSIS — R6889 Other general symptoms and signs: Secondary | ICD-10-CM

## 2015-05-16 MED ORDER — GUAIFENESIN ER 600 MG PO TB12
600.0000 mg | ORAL_TABLET | Freq: Once | ORAL | Status: AC
  Administered 2015-05-16: 600 mg via ORAL
  Filled 2015-05-16: qty 1

## 2015-05-16 MED ORDER — ACETAMINOPHEN 325 MG PO TABS
650.0000 mg | ORAL_TABLET | Freq: Once | ORAL | Status: AC
  Administered 2015-05-16: 650 mg via ORAL
  Filled 2015-05-16: qty 2

## 2015-05-16 MED ORDER — OSELTAMIVIR PHOSPHATE 75 MG PO CAPS: 75.0000 mg | ORAL_CAPSULE | Freq: Two times a day (BID) | ORAL | Status: DC

## 2015-05-16 MED ORDER — OSELTAMIVIR PHOSPHATE 75 MG PO CAPS
75.0000 mg | ORAL_CAPSULE | Freq: Once | ORAL | Status: AC
  Administered 2015-05-16: 75 mg via ORAL
  Filled 2015-05-16: qty 1

## 2015-05-16 NOTE — MAU Provider Note (Signed)
History     CSN: [id number]  Arrival date and time: 05/16/15 2044   None     Chief Complaint  Patient presents with  . Fever  . Cough  . Nasal Congestion   HPI  OB History    Gravida Para Term Preterm AB TAB SAB Ectopic Multiple Living   2    1 1     0      Patient is 39 y.o. G2P0010 2769w6d here with complaints of flu-like illness x3 days.  Patient was vacationing in [place] last week and was exposed to 2 children who by report were tested flu positive.  Since Friday, she has had myalgias, chills, cough (only productive of a small amount of yellow sputum), Tmax 99, nasal congestion.  She has taken Robutussin x1 this AM.  Has chest pain after coughing.  She also wonders if she should be concerned since she may have been bitten by a mosquito in [place].  +FM, denies LOF, VB, contractions, vaginal discharge.    Past Medical History  Diagnosis Date  . Sjogren's syndrome     Past Surgical History  Procedure Laterality Date  . Breast surgery      Family History  Problem Relation Age of Onset  . Cancer Maternal Grandmother     History  Substance Use Topics  . Smoking status: Never Smoker   . Smokeless tobacco: Never Used  . Alcohol Use: No    Allergies: No Known Allergies  Prescriptions prior to admission  Medication Sig Dispense Refill Last Dose  . cyclobenzaprine (FLEXERIL) 10 MG tablet To use PRN (Patient taking differently: Take 10 mg by mouth at bedtime as needed (for back pain). ) 10 tablet 0   . hydroxychloroquine (PLAQUENIL) 200 MG tablet Take 1 tablet (200 mg total) by mouth 2 (two) times daily. (Patient taking differently: Take 200 mg by mouth at bedtime. ) 60 tablet 1 04/07/2015 at Unknown time  . Ketotifen Fumarate (THERA TEARS ALLERGY OP) Apply 1 drop to eye 2 (two) times daily as needed (for dryness).   Past Week at Unknown time  . Prenatal Vit-Fe Fumarate-FA (PRENATAL MULTIVITAMIN) TABS tablet Take 1 tablet by mouth daily at 12 noon.   04/07/2015 at Unknown  time  . tobramycin (TOBREX) 0.3 % ophthalmic ointment Place 1 application into both eyes 3 (three) times daily. (Patient not taking: Reported on 01/10/2015) 3.5 g 0 Not Taking at Unknown time    Review of Systems  Constitutional: Positive for chills. Negative for fever and weight loss.  HENT: Negative for ear discharge and hearing loss.   Eyes: Negative for blurred vision, double vision, photophobia and pain.  Respiratory: Positive for cough. Negative for hemoptysis, shortness of breath and wheezing.   Cardiovascular: Positive for chest pain. Negative for palpitations and leg swelling.  Gastrointestinal: Negative for heartburn, nausea, vomiting and abdominal pain.  Genitourinary: Negative for dysuria, urgency, frequency and hematuria.  Musculoskeletal: Positive for myalgias. Negative for joint pain.  Skin: Negative for itching and rash.  Neurological: Positive for headaches. Negative for dizziness, sensory change, speech change, focal weakness and loss of consciousness.  Endo/Heme/Allergies: Negative.   Psychiatric/Behavioral: Negative.    Physical Exam   Blood pressure 121/71, pulse 99, temperature 98.8 F (37.1 C), temperature source Oral, resp. rate 18, height 5\' 4"  (1.626 m), weight 148 lb 6.4 oz (67.314 kg), last menstrual period 09/21/2014, SpO2 97 %.  Physical Exam  Constitutional: She is oriented to person, place, and time. She appears well-developed and well-nourished.  No distress.  HENT:  Head: Normocephalic and atraumatic.  Mouth/Throat: Oropharynx is clear and moist. No oropharyngeal exudate.  Eyes: EOM are normal. Pupils are equal, round, and reactive to light. Right eye exhibits no discharge. Left eye exhibits no discharge. No scleral icterus.  Neck: Normal range of motion. Neck supple.  Cardiovascular: Normal rate, regular rhythm, normal heart sounds and intact distal pulses.   No murmur heard. Respiratory: Effort normal and breath sounds normal. No respiratory distress.  She has no wheezes. She exhibits no tenderness.  GI: Soft. There is no tenderness. There is no rebound and no guarding.  gravid  Musculoskeletal: Normal range of motion. She exhibits no edema or tenderness.  Lymphadenopathy:    She has no cervical adenopathy.  Neurological: She is alert and oriented to person, place, and time. No cranial nerve deficit.  Skin: Skin is warm and dry. No rash noted.  Psychiatric: She has a normal mood and affect. Her behavior is normal. Thought content normal.    MAU Course  Procedures  MDM FHM: Cat 1, reassuring Toco: no UCs  Assessment and Plan  Patient is 40 y.o. G2P0010 [redacted]w[redacted]d with h/o sjogrens syndrome on plaquenil reporting flu-like illness likely secondary to viral illness.  In this immunosuppressed pregnant patient, however, will give empiric tamiflu - discharge home - reassured patient that no confirmed cases of Zika in [place] - 1st dose Tamiflu now, continue BID x5d - dose of mucinex and tylenol prior to d/c - Approved OTC meds list given - flu PCR sent - will f/u results - f/u at next scheduled prenatal appt (7/8) - fetal kick counts reinforced - preterm labor precautions   Erasmo Downer, MD, MPH PGY-2,  [facility] 05/16/2015 9:41 PM  Seen and examined by me also Agree with note Lungs are clear Cough is the most significant complaint Fevers have not been high, no fever now Does not appear toxic or in any respiratory distress Probable viral syndrome, but given exposure to known cases of flu, will treat as flu Plan as above Aviva Signs, CNM

## 2015-05-16 NOTE — Discharge Instructions (Signed)
Take tamiflu twice daily for the next 5 days.  You can use mucinex, nasal spray, and tylenol as needed.  We will call you about the results of your flu tests.  Influenza Influenza ("the flu") is a viral infection of the respiratory tract. It occurs more often in winter months because people spend more time in close contact with one another. Influenza can make you feel very sick. Influenza easily spreads from person to person (contagious). CAUSES  Influenza is caused by a virus that infects the respiratory tract. You can catch the virus by breathing in droplets from an infected person's cough or sneeze. You can also catch the virus by touching something that was recently contaminated with the virus and then touching your mouth, nose, or eyes. RISKS AND COMPLICATIONS You may be at risk for a more severe case of influenza if you smoke cigarettes, have diabetes, have chronic heart disease (such as heart failure) or lung disease (such as asthma), or if you have a weakened immune system. Elderly people and pregnant women are also at risk for more serious infections. The most common problem of influenza is a lung infection (pneumonia). Sometimes, this problem can require emergency medical care and may be life threatening. SIGNS AND SYMPTOMS  Symptoms typically last 4 to 10 days and may include:  Fever.  Chills.  Headache, body aches, and muscle aches.  Sore throat.  Chest discomfort and cough.  Poor appetite.  Weakness or feeling tired.  Dizziness.  Nausea or vomiting. DIAGNOSIS  Diagnosis of influenza is often made based on your history and a physical exam. A nose or throat swab test can be done to confirm the diagnosis. TREATMENT  In mild cases, influenza goes away on its own. Treatment is directed at relieving symptoms. For more severe cases, your health care provider may prescribe antiviral medicines to shorten the sickness. Antibiotic medicines are not effective because the infection is  caused by a virus, not by bacteria. HOME CARE INSTRUCTIONS  Take medicines only as directed by your health care provider.  Use a cool mist humidifier to make breathing easier.  Get plenty of rest until your temperature returns to normal. This usually takes 3 to 4 days.  Drink enough fluid to keep your urine clear or pale yellow.  Cover yourmouth and nosewhen coughing or sneezing,and wash your handswellto prevent thevirusfrom spreading.  Stay homefromwork orschool untilthe fever is gonefor at least 581full day. PREVENTION  An annual influenza vaccination (flu shot) is the best way to avoid getting influenza. An annual flu shot is now routinely recommended for all adults in the [place] SEEK MEDICAL CARE IF:  You experiencechest pain, yourcough worsens,or you producemore mucus.  Youhave nausea,vomiting, ordiarrhea.  Your fever returns or gets worse. SEEK IMMEDIATE MEDICAL CARE IF:  You havetrouble breathing, you become short of breath,or your skin ornails becomebluish.  You have severe painor stiffnessin the neck.  You develop a sudden headache, or pain in the face or ear.  You have nausea or vomiting that you cannot control. MAKE SURE YOU:   Understand these instructions.  Will watch your condition.  Will get help right away if you are not doing well or get worse. Document Released: 10/26/2000 Document Revised: 03/15/2014 Document Reviewed: 01/28/2012 [employer] Patient Information 2015 [place]. This information is not intended to replace advice given to you by your health care provider. Make sure you discuss any questions you have with your health care provider.

## 2015-05-16 NOTE — MAU Note (Signed)
Cold symptoms x 4 days. Chills & temp >99 today. Cough & congestion; burning in chest with coughing. Denies sore throat. Feels exhausted. Just got back from [place] where she was exposed to flu. Also got mosquito bite while there and concerned.  Denies contractions/LOF/vaginal bleeding.  Positive fetal movement.

## 2015-05-17 LAB — INFLUENZA PANEL BY PCR (TYPE A & B)
H1N1 flu by pcr: NOT DETECTED
INFLAPCR: NEGATIVE
INFLBPCR: NEGATIVE

## 2015-05-20 ENCOUNTER — Ambulatory Visit (HOSPITAL_COMMUNITY)
Admission: RE | Admit: 2015-05-20 | Discharge: 2015-05-20 | Disposition: A | Discharge status: Home / Self Care | Payer: 59 | Source: Ambulatory Visit | Attending: Family Medicine | Admitting: Family Medicine

## 2015-05-20 ENCOUNTER — Encounter: Payer: 59 | Admitting: Family

## 2015-05-20 ENCOUNTER — Encounter (HOSPITAL_COMMUNITY): Payer: Self-pay

## 2015-05-20 DIAGNOSIS — O99891 Other specified diseases and conditions complicating pregnancy: Secondary | ICD-10-CM | POA: Insufficient documentation

## 2015-05-20 DIAGNOSIS — Z3A34 34 weeks gestation of pregnancy: Secondary | ICD-10-CM | POA: Diagnosis not present

## 2015-05-20 DIAGNOSIS — O09522 Supervision of elderly multigravida, second trimester: Secondary | ICD-10-CM

## 2015-05-20 DIAGNOSIS — M3500 Sicca syndrome, unspecified: Secondary | ICD-10-CM | POA: Insufficient documentation

## 2015-05-20 DIAGNOSIS — O9989 Other specified diseases and conditions complicating pregnancy, childbirth and the puerperium: Secondary | ICD-10-CM

## 2015-05-20 DIAGNOSIS — O09519 Supervision of elderly primigravida, unspecified trimester: Secondary | ICD-10-CM | POA: Insufficient documentation

## 2015-05-20 DIAGNOSIS — O09523 Supervision of elderly multigravida, third trimester: Secondary | ICD-10-CM | POA: Insufficient documentation

## 2015-05-23 ENCOUNTER — Ambulatory Visit (INDEPENDENT_AMBULATORY_CARE_PROVIDER_SITE_OTHER): Payer: 59 | Admitting: Family Medicine

## 2015-05-23 VITALS — BP 124/81 | HR 96 | Temp 98.4°F | Wt 147.6 lb

## 2015-05-23 DIAGNOSIS — O0990 Supervision of high risk pregnancy, unspecified, unspecified trimester: Secondary | ICD-10-CM

## 2015-05-23 DIAGNOSIS — J40 Bronchitis, not specified as acute or chronic: Secondary | ICD-10-CM

## 2015-05-23 DIAGNOSIS — J069 Acute upper respiratory infection, unspecified: Secondary | ICD-10-CM

## 2015-05-23 DIAGNOSIS — O0993 Supervision of high risk pregnancy, unspecified, third trimester: Secondary | ICD-10-CM

## 2015-05-23 DIAGNOSIS — R768 Other specified abnormal immunological findings in serum: Secondary | ICD-10-CM

## 2015-05-23 DIAGNOSIS — O09513 Supervision of elderly primigravida, third trimester: Secondary | ICD-10-CM

## 2015-05-23 DIAGNOSIS — M3500 Sicca syndrome, unspecified: Secondary | ICD-10-CM

## 2015-05-23 LAB — CBC
HCT: 36.1 % (ref 36.0–46.0)
HEMOGLOBIN: 12.2 g/dL (ref 12.0–15.0)
MCH: 29.1 pg (ref 26.0–34.0)
MCHC: 33.8 g/dL (ref 30.0–36.0)
MCV: 86.2 fL (ref 78.0–100.0)
MPV: 9.8 fL (ref 8.6–12.4)
PLATELETS: 239 10*3/uL (ref 150–400)
RBC: 4.19 MIL/uL (ref 3.87–5.11)
RDW: 13.2 % (ref 11.5–15.5)
WBC: 9.4 10*3/uL (ref 4.0–10.5)

## 2015-05-23 LAB — COMPREHENSIVE METABOLIC PANEL
ALT: 18 U/L (ref 0–35)
AST: 23 U/L (ref 0–37)
Albumin: 2.7 g/dL — ABNORMAL LOW (ref 3.5–5.2)
Alkaline Phosphatase: 109 U/L (ref 39–117)
BUN: 12 mg/dL (ref 6–23)
CALCIUM: 8.5 mg/dL (ref 8.4–10.5)
CHLORIDE: 104 meq/L (ref 96–112)
CO2: 20 mEq/L (ref 19–32)
CREATININE: 0.96 mg/dL (ref 0.50–1.10)
Glucose, Bld: 84 mg/dL (ref 70–99)
POTASSIUM: 4 meq/L (ref 3.5–5.3)
SODIUM: 133 meq/L — AB (ref 135–145)
Total Bilirubin: 0.4 mg/dL (ref 0.2–1.2)
Total Protein: 7.6 g/dL (ref 6.0–8.3)

## 2015-05-23 LAB — POCT URINALYSIS DIP (DEVICE)
BILIRUBIN URINE: NEGATIVE
GLUCOSE, UA: NEGATIVE mg/dL
Hgb urine dipstick: NEGATIVE
KETONES UR: NEGATIVE mg/dL
Leukocytes, UA: NEGATIVE
Nitrite: NEGATIVE
PH: 6 (ref 5.0–8.0)
PROTEIN: NEGATIVE mg/dL
Urobilinogen, UA: 0.2 mg/dL (ref 0.0–1.0)

## 2015-05-23 LAB — SEDIMENTATION RATE: Sed Rate: 126 mm/hr — ABNORMAL HIGH (ref 0–20)

## 2015-05-23 MED ORDER — AZITHROMYCIN 250 MG PO TABS: ORAL_TABLET | ORAL | Status: DC

## 2015-05-23 MED ORDER — HYDROCOD POLST-CPM POLST ER 10-8 MG/5ML PO SUER: 5.0000 mL | Freq: Every evening | ORAL | Status: DC

## 2015-05-23 NOTE — Progress Notes (Signed)
Patient reports cough for week and three days now; not improving. Went to [facility] on 7/4 and treated with tamiflu but flu culture negative. Tried multiple OTC medications, but no improvement

## 2015-05-23 NOTE — Progress Notes (Signed)
Subjective:  Jillian Macias is a 39 y.o. G2P0010 at 3223w6d being seen today for ongoing prenatal care.  Patient reports cough/URI sx  Contractions: Irritability.  Vag. Bleeding: None. Movement: Present. Denies leaking of fluid.   URI sx: started >1 week ago (1 week and 3 days). Reports productive cough with green sputum. Reports sputum was green in the beginning and then became clear then green again. Denies fever/chills, nausea/vomiting, rigors. Reports use of multiple OTC agents. She also was prescribed Tamiflu which did not help sx. + sick contacts (2 nephews).   The following portions of the patient's history were reviewed and updated as appropriate: allergies, current medications, past family history, past medical history, past social history, past surgical history and problem list.   Objective:   Filed Vitals:   05/23/15 1407  BP: 124/81  Pulse: 96  Temp: 98.4 F (36.9 C)  Weight: 147 lb 9.6 oz (66.951 kg)    Fetal Status: Fetal Heart Rate (bpm): 158   Movement: Present     General:  Alert, oriented and cooperative. Patient is in no acute distress.  Skin: Skin is warm and dry. No rash noted.   Cardiovascular: Normal heart rate noted  Respiratory: Normal respiratory effort. Diffusely ronchorous sounds with mild inspiratory stridor.   Abdomen: Soft, gravid, appropriate for gestational age. Pain/Pressure: Absent     Vaginal: Vag. Bleeding: None.       Extremities: Normal range of motion.  Edema: None  Mental Status: Normal mood and affect. Normal behavior. Normal judgment and thought content.   Urinalysis: Urine Protein: Negative Urine Glucose: Negative  Assessment and Plan:  Pregnancy: G2P0010 at 2423w6d  1. Sjogren's disease -cont palequinil, reviewed last US  2. Supervision of high risk pregnancy, antepartum, unspecified trimester Continue routine prenatal care, GBS next visit  3. SS-A antibody positive Fetal echos obtained biweekly until 28 weeks.   4. SS-B antibody  positive  5. Maternal age 38+, primigravida, antepartum, third trimester NIPT, low risk  6. URI symptoms: consistent with likely superimposed bacterial bronchitis.  Prescribed Z-pak and Tussionex for cough suppression. Return precautions discussed with patient.   Term labor symptoms and general obstetric precautions including but not limited to vaginal bleeding, contractions, leaking of fluid and fetal movement were reviewed in detail with the patient.  Please refer to After Visit Summary for other counseling recommendations.   [facility]2015[facility]   Fredirick LatheKristy Noeh Sparacino, MD

## 2015-05-23 NOTE — Patient Instructions (Signed)
Third Trimester of Pregnancy The third trimester is from week 29 through week 42, months 7 through 9. The third trimester is a time when the fetus is growing rapidly. At the end of the ninth month, the fetus is about 20 inches in length and weighs 6-10 pounds.  BODY CHANGES Your body goes through many changes during pregnancy. The changes vary from woman to woman.   Your weight will continue to increase. You can expect to gain 25-35 pounds (11-16 kg) by the end of the pregnancy.  You may begin to get stretch marks on your hips, abdomen, and breasts.  You may urinate more often because the fetus is moving lower into your pelvis and pressing on your bladder.  You may develop or continue to have heartburn as a result of your pregnancy.  You may develop constipation because certain hormones are causing the muscles that push waste through your intestines to slow down.  You may develop hemorrhoids or swollen, bulging veins (varicose veins).  You may have pelvic pain because of the weight gain and pregnancy hormones relaxing your joints between the bones in your pelvis. Backaches may result from overexertion of the muscles supporting your posture.  You may have changes in your hair. These can include thickening of your hair, rapid growth, and changes in texture. Some women also have hair loss during or after pregnancy, or hair that feels dry or thin. Your hair will most likely return to normal after your baby is born.  Your breasts will continue to grow and be tender. A yellow discharge may leak from your breasts called colostrum.  Your belly button may stick out.  You may feel short of breath because of your expanding uterus.  You may notice the fetus "dropping," or moving lower in your abdomen.  You may have a bloody mucus discharge. This usually occurs a few days to a week before labor begins.  Your cervix becomes thin and soft (effaced) near your due date. WHAT TO EXPECT AT YOUR  PRENATAL EXAMS  You will have prenatal exams every 2 weeks until week 36. Then, you will have weekly prenatal exams. During a routine prenatal visit:  You will be weighed to make sure you and the fetus are growing normally.  Your blood pressure is taken.  Your abdomen will be measured to track your baby's growth.  The fetal heartbeat will be listened to.  Any test results from the previous visit will be discussed.  You may have a cervical check near your due date to see if you have effaced. At around 36 weeks, your caregiver will check your cervix. At the same time, your caregiver will also perform a test on the secretions of the vaginal tissue. This test is to determine if a type of bacteria, Group B streptococcus, is present. Your caregiver will explain this further. Your caregiver may ask you:  What your birth plan is.  How you are feeling.  If you are feeling the baby move.  If you have had any abnormal symptoms, such as leaking fluid, bleeding, severe headaches, or abdominal cramping.  If you have any questions. Other tests or screenings that may be performed during your third trimester include:  Blood tests that check for low iron levels (anemia).  Fetal testing to check the health, activity level, and growth of the fetus. Testing is done if you have certain medical conditions or if there are problems during the pregnancy. FALSE LABOR You may feel small, irregular contractions that   eventually go away. These are called Braxton Hicks contractions, or false labor. Contractions may last for hours, days, or even weeks before true labor sets in. If contractions come at regular intervals, intensify, or become painful, it is best to be seen by your caregiver.  SIGNS OF LABOR   Menstrual-like cramps.  Contractions that are 5 minutes apart or less.  Contractions that start on the top of the uterus and spread down to the lower abdomen and back.  A sense of increased pelvic  pressure or back pain.  A watery or bloody mucus discharge that comes from the vagina. If you have any of these signs before the 37th week of pregnancy, call your caregiver right away. You need to go to the hospital to get checked immediately. HOME CARE INSTRUCTIONS   Avoid all smoking, herbs, alcohol, and unprescribed drugs. These chemicals affect the formation and growth of the baby.  Follow your caregiver's instructions regarding medicine use. There are medicines that are either safe or unsafe to take during pregnancy.  Exercise only as directed by your caregiver. Experiencing uterine cramps is a good sign to stop exercising.  Continue to eat regular, healthy meals.  Wear a good support bra for breast tenderness.  Do not use hot tubs, steam rooms, or saunas.  Wear your seat belt at all times when driving.  Avoid raw meat, uncooked cheese, cat litter boxes, and soil used by cats. These carry germs that can cause birth defects in the baby.  Take your prenatal vitamins.  Try taking a stool softener (if your caregiver approves) if you develop constipation. Eat more high-fiber foods, such as fresh vegetables or fruit and whole grains. Drink plenty of fluids to keep your urine clear or pale yellow.  Take warm sitz baths to soothe any pain or discomfort caused by hemorrhoids. Use hemorrhoid cream if your caregiver approves.  If you develop varicose veins, wear support hose. Elevate your feet for 15 minutes, 3-4 times a day. Limit salt in your diet.  Avoid heavy lifting, wear low heal shoes, and practice good posture.  Rest a lot with your legs elevated if you have leg cramps or low back pain.  Visit your dentist if you have not gone during your pregnancy. Use a soft toothbrush to brush your teeth and be gentle when you floss.  A sexual relationship may be continued unless your caregiver directs you otherwise.  Do not travel far distances unless it is absolutely necessary and only  with the approval of your caregiver.  Take prenatal classes to understand, practice, and ask questions about the labor and delivery.  Make a trial run to the hospital.  Pack your hospital bag.  Prepare the baby's nursery.  Continue to go to all your prenatal visits as directed by your caregiver. SEEK MEDICAL CARE IF:  You are unsure if you are in labor or if your water has broken.  You have dizziness.  You have mild pelvic cramps, pelvic pressure, or nagging pain in your abdominal area.  You have persistent nausea, vomiting, or diarrhea.  You have a bad smelling vaginal discharge.  You have pain with urination. SEEK IMMEDIATE MEDICAL CARE IF:   You have a fever.  You are leaking fluid from your vagina.  You have spotting or bleeding from your vagina.  You have severe abdominal cramping or pain.  You have rapid weight loss or gain.  You have shortness of breath with chest pain.  You notice sudden or extreme swelling   of your face, hands, ankles, feet, or legs.  You have not felt your baby move in over an hour.  You have severe headaches that do not go away with medicine.  You have vision changes. Document Released: 10/23/2001 Document Revised: 11/03/2013 Document Reviewed: 12/30/2012 [employer] Patient Information 2015 [employer]. This information is not intended to replace advice given to you by your health care provider. Make sure you discuss any questions you have with your health care provider.  Breastfeeding Deciding to breastfeed is one of the best choices you can make for you and your baby. A change in hormones during pregnancy causes your breast tissue to grow and increases the number and size of your milk ducts. These hormones also allow proteins, sugars, and fats from your blood supply to make breast milk in your milk-producing glands. Hormones prevent breast milk from being released before your baby is born as well as prompt milk flow after birth. Once  breastfeeding has begun, thoughts of your baby, as well as his or her sucking or crying, can stimulate the release of milk from your milk-producing glands.  BENEFITS OF BREASTFEEDING For Your Baby  Your first milk (colostrum) helps your baby's digestive system function better.   There are antibodies in your milk that help your baby fight off infections.   Your baby has a lower incidence of asthma, allergies, and sudden infant death syndrome.   The nutrients in breast milk are better for your baby than infant formulas and are designed uniquely for your baby's needs.   Breast milk improves your baby's brain development.   Your baby is less likely to develop other conditions, such as childhood obesity, asthma, or type 2 diabetes mellitus.  For You   Breastfeeding helps to create a very special bond between you and your baby.   Breastfeeding is convenient. Breast milk is always available at the correct temperature and costs nothing.   Breastfeeding helps to burn calories and helps you lose the weight gained during pregnancy.   Breastfeeding makes your uterus contract to its prepregnancy size faster and slows bleeding (lochia) after you give birth.   Breastfeeding helps to lower your risk of developing type 2 diabetes mellitus, osteoporosis, and breast or ovarian cancer later in life. SIGNS THAT YOUR BABY IS HUNGRY Early Signs of Hunger  Increased alertness or activity.  Stretching.  Movement of the head from side to side.  Movement of the head and opening of the mouth when the corner of the mouth or cheek is stroked (rooting).  Increased sucking sounds, smacking lips, cooing, sighing, or squeaking.  Hand-to-mouth movements.  Increased sucking of fingers or hands. Late Signs of Hunger  Fussing.  Intermittent crying. Extreme Signs of Hunger Signs of extreme hunger will require calming and consoling before your baby will be able to breastfeed successfully. Do not  wait for the following signs of extreme hunger to occur before you initiate breastfeeding:   Restlessness.  A loud, strong cry.   Screaming. BREASTFEEDING BASICS Breastfeeding Initiation  Find a comfortable place to sit or lie down, with your neck and back well supported.  Place a pillow or rolled up blanket under your baby to bring him or her to the level of your breast (if you are seated). Nursing pillows are specially designed to help support your arms and your baby while you breastfeed.  Make sure that your baby's abdomen is facing your abdomen.   Gently massage your breast. With your fingertips, massage from your chest   wall toward your nipple in a circular motion. This encourages milk flow. You may need to continue this action during the feeding if your milk flows slowly.  Support your breast with 4 fingers underneath and your thumb above your nipple. Make sure your fingers are well away from your nipple and your baby's mouth.   Stroke your baby's lips gently with your finger or nipple.   When your baby's mouth is open wide enough, quickly bring your baby to your breast, placing your entire nipple and as much of the colored area around your nipple (areola) as possible into your baby's mouth.   More areola should be visible above your baby's upper lip than below the lower lip.   Your baby's tongue should be between his or her lower gum and your breast.   Ensure that your baby's mouth is correctly positioned around your nipple (latched). Your baby's lips should create a seal on your breast and be turned out (everted).  It is common for your baby to suck about 2-3 minutes in order to start the flow of breast milk. Latching Teaching your baby how to latch on to your breast properly is very important. An improper latch can cause nipple pain and decreased milk supply for you and poor weight gain in your baby. Also, if your baby is not latched onto your nipple properly, he or she  may swallow some air during feeding. This can make your baby fussy. Burping your baby when you switch breasts during the feeding can help to get rid of the air. However, teaching your baby to latch on properly is still the best way to prevent fussiness from swallowing air while breastfeeding. Signs that your baby has successfully latched on to your nipple:    Silent tugging or silent sucking, without causing you pain.   Swallowing heard between every 3-4 sucks.    Muscle movement above and in front of his or her ears while sucking.  Signs that your baby has not successfully latched on to nipple:   Sucking sounds or smacking sounds from your baby while breastfeeding.  Nipple pain. If you think your baby has not latched on correctly, slip your finger into the corner of your baby's mouth to break the suction and place it between your baby's gums. Attempt breastfeeding initiation again. Signs of Successful Breastfeeding Signs from your baby:   A gradual decrease in the number of sucks or complete cessation of sucking.   Falling asleep.   Relaxation of his or her body.   Retention of a small amount of milk in his or her mouth.   Letting go of your breast by himself or herself. Signs from you:  Breasts that have increased in firmness, weight, and size 1-3 hours after feeding.   Breasts that are softer immediately after breastfeeding.  Increased milk volume, as well as a change in milk consistency and color by the fifth day of breastfeeding.   Nipples that are not sore, cracked, or bleeding. Signs That Your Baby is Getting Enough Milk  Wetting at least 3 diapers in a 24-hour period. The urine should be clear and pale yellow by age 5 days.  At least 3 stools in a 24-hour period by age 5 days. The stool should be soft and yellow.  At least 3 stools in a 24-hour period by age 7 days. The stool should be seedy and yellow.  No loss of weight greater than 10% of birth weight  during the first 3   days of age.  Average weight gain of 4-7 ounces (113-198 g) per week after age 4 days.  Consistent daily weight gain by age 5 days, without weight loss after the age of 2 weeks. After a feeding, your baby may spit up a small amount. This is common. BREASTFEEDING FREQUENCY AND DURATION Frequent feeding will help you make more milk and can prevent sore nipples and breast engorgement. Breastfeed when you feel the need to reduce the fullness of your breasts or when your baby shows signs of hunger. This is called "breastfeeding on demand." Avoid introducing a pacifier to your baby while you are working to establish breastfeeding (the first 4-6 weeks after your baby is born). After this time you may choose to use a pacifier. Research has shown that pacifier use during the first year of a baby's life decreases the risk of sudden infant death syndrome (SIDS). Allow your baby to feed on each breast as long as he or she wants. Breastfeed until your baby is finished feeding. When your baby unlatches or falls asleep while feeding from the first breast, offer the second breast. Because newborns are often sleepy in the first few weeks of life, you may need to awaken your baby to get him or her to feed. Breastfeeding times will vary from baby to baby. However, the following rules can serve as a guide to help you ensure that your baby is properly fed:  Newborns (babies 4 weeks of age or younger) may breastfeed every 1-3 hours.  Newborns should not go longer than 3 hours during the day or 5 hours during the night without breastfeeding.  You should breastfeed your baby a minimum of 8 times in a 24-hour period until you begin to introduce solid foods to your baby at around 6 months of age. BREAST MILK PUMPING Pumping and storing breast milk allows you to ensure that your baby is exclusively fed your breast milk, even at times when you are unable to breastfeed. This is especially important if you are  going back to work while you are still breastfeeding or when you are not able to be present during feedings. Your lactation consultant can give you guidelines on how long it is safe to store breast milk.  A breast pump is a machine that allows you to pump milk from your breast into a sterile bottle. The pumped breast milk can then be stored in a refrigerator or freezer. Some breast pumps are operated by hand, while others use electricity. Ask your lactation consultant which type will work best for you. Breast pumps can be purchased, but some hospitals and breastfeeding support groups lease breast pumps on a monthly basis. A lactation consultant can teach you how to hand express breast milk, if you prefer not to use a pump.  CARING FOR YOUR BREASTS WHILE YOU BREASTFEED Nipples can become dry, cracked, and sore while breastfeeding. The following recommendations can help keep your breasts moisturized and healthy:  Avoid using soap on your nipples.   Wear a supportive bra. Although not required, special nursing bras and tank tops are designed to allow access to your breasts for breastfeeding without taking off your entire bra or top. Avoid wearing underwire-style bras or extremely tight bras.  Air dry your nipples for 3-4minutes after each feeding.   Use only cotton bra pads to absorb leaked breast milk. Leaking of breast milk between feedings is normal.   Use lanolin on your nipples after breastfeeding. Lanolin helps to maintain your skin's   normal moisture barrier. If you use pure lanolin, you do not need to wash it off before feeding your baby again. Pure lanolin is not toxic to your baby. You may also hand express a few drops of breast milk and gently massage that milk into your nipples and allow the milk to air dry. In the first few weeks after giving birth, some women experience extremely full breasts (engorgement). Engorgement can make your breasts feel heavy, warm, and tender to the touch.  Engorgement peaks within 3-5 days after you give birth. The following recommendations can help ease engorgement:  Completely empty your breasts while breastfeeding or pumping. You may want to start by applying warm, moist heat (in the shower or with warm water-soaked hand towels) just before feeding or pumping. This increases circulation and helps the milk flow. If your baby does not completely empty your breasts while breastfeeding, pump any extra milk after he or she is finished.  Wear a snug bra (nursing or regular) or tank top for 1-2 days to signal your body to slightly decrease milk production.  Apply ice packs to your breasts, unless this is too uncomfortable for you.  Make sure that your baby is latched on and positioned properly while breastfeeding. If engorgement persists after 48 hours of following these recommendations, contact your health care provider or a lactation consultant. OVERALL HEALTH CARE RECOMMENDATIONS WHILE BREASTFEEDING  Eat healthy foods. Alternate between meals and snacks, eating 3 of each per day. Because what you eat affects your breast milk, some of the foods may make your baby more irritable than usual. Avoid eating these foods if you are sure that they are negatively affecting your baby.  Drink milk, fruit juice, and water to satisfy your thirst (about 10 glasses a day).   Rest often, relax, and continue to take your prenatal vitamins to prevent fatigue, stress, and anemia.  Continue breast self-awareness checks.  Avoid chewing and smoking tobacco.  Avoid alcohol and drug use. Some medicines that may be harmful to your baby can pass through breast milk. It is important to ask your health care provider before taking any medicine, including all over-the-counter and prescription medicine as well as vitamin and herbal supplements. It is possible to become pregnant while breastfeeding. If birth control is desired, ask your health care provider about options that  will be safe for your baby. SEEK MEDICAL CARE IF:   You feel like you want to stop breastfeeding or have become frustrated with breastfeeding.  You have painful breasts or nipples.  Your nipples are cracked or bleeding.  Your breasts are red, tender, or warm.  You have a swollen area on either breast.  You have a fever or chills.  You have nausea or vomiting.  You have drainage other than breast milk from your nipples.  Your breasts do not become full before feedings by the fifth day after you give birth.  You feel sad and depressed.  Your baby is too sleepy to eat well.  Your baby is having trouble sleeping.   Your baby is wetting less than 3 diapers in a 24-hour period.  Your baby has less than 3 stools in a 24-hour period.  Your baby's skin or the white part of his or her eyes becomes yellow.   Your baby is not gaining weight by 5 days of age. SEEK IMMEDIATE MEDICAL CARE IF:   Your baby is overly tired (lethargic) and does not want to wake up and feed.  Your baby   develops an unexplained fever. Document Released: 10/29/2005 Document Revised: 11/03/2013 Document Reviewed: 04/22/2013 [employer] Patient Information 2015 [employer]. This information is not intended to replace advice given to you by your health care provider. Make sure you discuss any questions you have with your health care provider.  

## 2015-05-24 LAB — BETA-2 GLYCOPROTEIN ANTIBODIES
BETA 2 GLYCO I IGG: 4 G Units (ref <20)
Beta-2-Glycoprotein I IgA: 9 A Units (ref <20)
Beta-2-Glycoprotein I IgM: 6 M Units (ref <20)

## 2015-05-24 LAB — ANTI-NUCLEAR AB-TITER (ANA TITER): ANA Titer 1: 1:40 {titer} — ABNORMAL HIGH

## 2015-05-24 LAB — ANA: Anti Nuclear Antibody(ANA): POSITIVE — AB

## 2015-05-24 LAB — CARDIOLIPIN ANTIBODY: PHOSPHOLIPIDS: 218 mg/dL (ref 151–264)

## 2015-05-26 LAB — LUPUS ANTICOAGULANT PANEL
DRVVT: 33.6 s (ref <42.9)
Lupus Anticoagulant: NOT DETECTED
PTT Lupus Anticoagulant: 42.4 secs (ref 28.0–43.0)

## 2015-05-27 ENCOUNTER — Encounter (HOSPITAL_COMMUNITY): Payer: Self-pay | Admitting: *Deleted

## 2015-05-27 ENCOUNTER — Inpatient Hospital Stay (HOSPITAL_COMMUNITY)
Admission: AD | Admit: 2015-05-27 | Discharge: 2015-05-27 | Disposition: A | Discharge status: Home / Self Care | Payer: 59 | Source: Ambulatory Visit | Attending: Obstetrics and Gynecology | Admitting: Obstetrics and Gynecology

## 2015-05-27 DIAGNOSIS — R05 Cough: Secondary | ICD-10-CM | POA: Diagnosis present

## 2015-05-27 DIAGNOSIS — Z3A35 35 weeks gestation of pregnancy: Secondary | ICD-10-CM | POA: Insufficient documentation

## 2015-05-27 DIAGNOSIS — O99513 Diseases of the respiratory system complicating pregnancy, third trimester: Secondary | ICD-10-CM | POA: Insufficient documentation

## 2015-05-27 DIAGNOSIS — J40 Bronchitis, not specified as acute or chronic: Secondary | ICD-10-CM | POA: Diagnosis not present

## 2015-05-27 MED ORDER — CYCLOBENZAPRINE HCL 10 MG PO TABS
10.0000 mg | ORAL_TABLET | Freq: Once | ORAL | Status: AC
  Administered 2015-05-27: 10 mg via ORAL
  Filled 2015-05-27: qty 1

## 2015-05-27 MED ORDER — GUAIFENESIN-CODEINE 100-10 MG/5ML PO SOLN
10.0000 mL | Freq: Once | ORAL | Status: AC
  Administered 2015-05-27: 10 mL via ORAL
  Filled 2015-05-27: qty 10

## 2015-05-27 NOTE — [facility] Note (Signed)
Patient presents to [facility] with c/o cough and possible pulled back muscle on right side. Patient states was seen in office and dx with bronchitis. Currently on Zpack; states has gone through 2 bottles of cough syrup. +FM

## 2015-05-27 NOTE — MAU Provider Note (Signed)
First Provider Initiated Contact with Patient 05/27/15 0406      Chief Complaint:  Cough   Jillian Macias is  39 y.o. G2P0010 at 6734w3d presents complaining of Cough She has been dealing with URI sx for 2 weeks now, initially treated with tamiflu (stated that she had been around some kids with tested + for the flu, although she denies saying the kids actually tested +).  She was given a zpack, "cough syrup with codeine" and flexeril 4 days ago in clinic, but only filled the zpack because the rest was too expensive.  Discount drug app says guifenesin w/codeine is $13 and flexeril is $7, which pt states is afforadable.  She now wishes to get rx for theses meds.   Obstetrical/Gynecological History: OB History    Gravida Para Term Preterm AB TAB SAB Ectopic Multiple Living   2    1 1     0     Past Medical History: Past Medical History  Diagnosis Date  . Sjogren's syndrome     Past Surgical History: Past Surgical History  Procedure Laterality Date  . Breast surgery      Family History: Family History  Problem Relation Age of Onset  . Cancer Maternal Grandmother     Social History: History  Substance Use Topics  . Smoking status: Never Smoker   . Smokeless tobacco: Never Used  . Alcohol Use: No    Allergies: No Known Allergies  Meds:  Prescriptions prior to admission  Medication Sig Dispense Refill Last Dose  . ascorbic acid (VITAMIN C) 1000 MG tablet Take 1,000 mg by mouth daily.   05/16/2015 at Unknown time  . cyclobenzaprine (FLEXERIL) 10 MG tablet To use PRN (Patient not taking: Reported on 05/16/2015) 10 tablet 0 Not Taking at Unknown time  . hydroxychloroquine (PLAQUENIL) 200 MG tablet Take 1 tablet (200 mg total) by mouth 2 (two) times daily. (Patient taking differently: Take 200 mg by mouth at bedtime. ) 60 tablet 1 05/15/2015 at 2100  . Ketotifen Fumarate (THERA TEARS ALLERGY OP) Apply 1 drop to eye 5 (five) times daily.    05/16/2015 at 2100  . loratadine (CLARITIN) 10  MG tablet Take 10 mg by mouth daily as needed (congestion).    05/16/2015 at 0900  . oseltamivir (TAMIFLU) 75 MG capsule Take 1 capsule (75 mg total) by mouth 2 (two) times daily. 9 capsule 0   . Prenatal Vit-Fe Fumarate-FA (PRENATAL MULTIVITAMIN) TABS tablet Take 2 tablets by mouth daily at 12 noon.    05/15/2015 at Unknown time  . tobramycin (TOBREX) 0.3 % ophthalmic ointment Place 1 application into both eyes 3 (three) times daily. (Patient not taking: Reported on 01/10/2015) 3.5 g 0 Not Taking at Unknown time    Review of Systems   Constitutional: Negative for fever and chills Eyes: Negative for visual disturbances Respiratory: Negative for shortness of breath, dyspnea.  Feels as if she can't take a deep breath a lot of the time.  Feels a constant tickle in her throat and coughs (dry) constantly Cardiovascular: Negative for chest pain or palpitations  Gastrointestinal: Negative for vomiting, diarrhea and constipation Genitourinary: Negative for dysuria and urgency Musculoskeletal: POSITIVE for right sided back pain;  Normal ROM  Neurological: Negative for dizziness and headaches    Physical Exam  Blood pressure 134/72, pulse 77, temperature 97.9 F (36.6 C), temperature source Oral, resp. rate 20, height 5\' 4"  (1.626 m), weight 68.312 kg (150 lb 9.6 oz), last menstrual period 09/21/2014, SpO2 99 %.  GENERAL: Well-developed, well-nourished female in no acute distress. Has a nearly constant dry cough.  Relieved only by drinking sips of water.  THROAT:  Non erythemous, no exudate LUNGS: Clear to auscultation bilaterally. No wheezes and normal breath sounds in all lung fields HEART: Regular rate and rhythm. ABDOMEN: Soft, nontender, nondistended, gravid.  BACK:  Tender, sore on right side of back along spinous muscle. Pain is reproduceable with light touch.  No CVAT.  Exacerbated my movement EXTREMITIES: Nontender, no edema, 2+ distal pulses. DTR's 2+ FHT:  Cat 1 tracing, no ctx  Labs: No  results found for this or any previous visit (from the past 24 hour(s)). Imaging Studies:  No results found.  Assessment: Jillian Macias is  39 y.o. G2P0010 at [redacted]w[redacted]d presents with bronchitis, cough, most likely sore muscle from coughing. .  Plan: Rx guifenasin w/codeine # 240cc and Flexeril 10 mg prn.  Ice to back.  F/U if becomes SOB, wheezing, condition deteriorates.   CRESENZO-DISHMAN,Jillian Macias 7/15/20164:21 AM

## 2015-06-02 ENCOUNTER — Encounter (HOSPITAL_COMMUNITY): Payer: Self-pay

## 2015-06-02 ENCOUNTER — Encounter (HOSPITAL_COMMUNITY): Payer: Self-pay | Admitting: *Deleted

## 2015-06-02 ENCOUNTER — Encounter: Payer: 59 | Admitting: Obstetrics & Gynecology

## 2015-06-02 ENCOUNTER — Ambulatory Visit: Payer: 59 | Admitting: General Practice

## 2015-06-02 ENCOUNTER — Telehealth: Payer: Self-pay | Admitting: *Deleted

## 2015-06-02 VITALS — BP 119/73 | HR 74 | Temp 97.6°F | Ht 64.0 in | Wt 148.8 lb

## 2015-06-02 DIAGNOSIS — Z3493 Encounter for supervision of normal pregnancy, unspecified, third trimester: Secondary | ICD-10-CM

## 2015-06-02 NOTE — Progress Notes (Signed)
Patient came back to clinic at 345 for fetal HR check. Heart rate auscultated at 145bpm. Patient reports no problems. appt next week on 7/28.

## 2015-06-02 NOTE — Progress Notes (Signed)
Pt here today for FHR and left the facility without obtaining.  Called pt at [phone] and pt was informed from front office that she has left because she had to be a work @ 0900.  Pt's appt scheduled @ 0900

## 2015-06-02 NOTE — Telephone Encounter (Signed)
Pt left message yesterday @ 1459 stating that she would not be able to keep her appt as scheduled today. Her next appt is 7/28. She states that she was told she will be induced @ 39 wks (8/9) and wants to know if that appt can be made so that she can inform her employer. She does not want to wait until 7/28 to schedule the induction. I returned pt's call and informed her that we cannot schedule the induction more than 2 weeks in advance, therefore her induction plan will be made at her visit on 7/28. Pt then asked if the induction could be on 8/11 instead of 8/9. I advised that she will need to discuss her request with the provider.  Pt voiced understanding of all information and instructions.

## 2015-06-09 ENCOUNTER — Encounter: Payer: Self-pay | Admitting: Family

## 2015-06-09 ENCOUNTER — Ambulatory Visit (INDEPENDENT_AMBULATORY_CARE_PROVIDER_SITE_OTHER): Payer: 59 | Admitting: Family

## 2015-06-09 ENCOUNTER — Ambulatory Visit (INDEPENDENT_AMBULATORY_CARE_PROVIDER_SITE_OTHER): Payer: 59 | Admitting: *Deleted

## 2015-06-09 VITALS — BP 125/85 | HR 80 | Temp 97.8°F | Wt 151.3 lb

## 2015-06-09 VITALS — BP 107/74 | HR 90

## 2015-06-09 DIAGNOSIS — R768 Other specified abnormal immunological findings in serum: Secondary | ICD-10-CM

## 2015-06-09 DIAGNOSIS — O99413 Diseases of the circulatory system complicating pregnancy, third trimester: Secondary | ICD-10-CM

## 2015-06-09 DIAGNOSIS — I099 Rheumatic heart disease, unspecified: Secondary | ICD-10-CM

## 2015-06-09 DIAGNOSIS — M3500 Sicca syndrome, unspecified: Secondary | ICD-10-CM | POA: Diagnosis not present

## 2015-06-09 DIAGNOSIS — O09513 Supervision of elderly primigravida, third trimester: Secondary | ICD-10-CM

## 2015-06-09 DIAGNOSIS — O0990 Supervision of high risk pregnancy, unspecified, unspecified trimester: Secondary | ICD-10-CM

## 2015-06-09 DIAGNOSIS — IMO0002 Reserved for concepts with insufficient information to code with codable children: Secondary | ICD-10-CM | POA: Insufficient documentation

## 2015-06-09 DIAGNOSIS — R760 Raised antibody titer: Secondary | ICD-10-CM | POA: Diagnosis not present

## 2015-06-09 DIAGNOSIS — Z3493 Encounter for supervision of normal pregnancy, unspecified, third trimester: Secondary | ICD-10-CM

## 2015-06-09 LAB — POCT URINALYSIS DIP (DEVICE)
Bilirubin Urine: NEGATIVE
GLUCOSE, UA: NEGATIVE mg/dL
Hgb urine dipstick: NEGATIVE
Ketones, ur: NEGATIVE mg/dL
Leukocytes, UA: NEGATIVE
NITRITE: NEGATIVE
PROTEIN: NEGATIVE mg/dL
Specific Gravity, Urine: <=1.005 (ref 1.005–1.030)
UROBILINOGEN UA: 0.2 mg/dL (ref 0.0–1.0)
pH: 5.5 (ref 5.0–8.0)

## 2015-06-09 LAB — OB RESULTS CONSOLE GBS: GBS: NEGATIVE

## 2015-06-09 NOTE — Progress Notes (Signed)
IOL scheduled for 06/23/2015 @7:30 AM 

## 2015-06-09 NOTE — Progress Notes (Signed)
NST

## 2015-06-09 NOTE — Progress Notes (Signed)
Subjective:  Jillian Macias is a 39 y.o. G3P0020 at [redacted]w[redacted]d being seen today for ongoing prenatal care.  Patient reports continued cough; some improvement after zpak.  Denies fever, body aches or chill.  Nasal congestion and sore throat.  .  Contractions: Not present.  Vag. Bleeding: None. Movement: Present. Denies leaking of fluid.   The following portions of the patient's history were reviewed and updated as appropriate: allergies, current medications, past family history, past medical history, past social history, past surgical history and problem list.   Objective:   Filed Vitals:   06/09/15 0809 06/09/15 0815  BP: 128/92 125/85  Pulse: 77 80  Temp: 97.8 F (36.6 C)   Weight: 151 lb 4.8 oz (68.629 kg)     Fetal Status: Fetal Heart Rate (bpm): 154 Fundal Height: 37 cm Movement: Present  Presentation: Vertex  General:  Alert, oriented and cooperative. Patient is in no acute distress.  Skin: Skin is warm and dry. No rash noted.   HEENT Sinuses nontender with palp; throat red, no exudate  Cardiovascular: Normal heart rate noted  Respiratory: Normal respiratory effort, no problems with respiration noted; lungs clear throughout auscultation  Abdomen: Soft, gravid, appropriate for gestational age. Pain/Pressure: Absent     Vaginal: Vag. Bleeding: None.    Vag D/C Character: Mucous  Cervix: Not evaluated        Extremities: Normal range of motion.  Edema: Trace  Mental Status: Normal mood and affect. Normal behavior. Normal judgment and thought content.   Urinalysis: Urine Protein: Negative Urine Glucose: Negative  Assessment and Plan:  Pregnancy: G3P0020 at [redacted]w[redacted]d  1. Sjogren's syndrome - Consulted with Dr. Macon Large > begin fetal testing today - IOL scheduled for 06/23/15  2. Prenatal care, third trimester - GC/Chlamydia Probe Amp - Culture, beta strep (group b only)  3. Maternal rheumatic heart disease, third trimester  4. Upper Respiratory Infection - recommended OTC meds for  symptoms; chest xray offered > pt declines  5. Maternal age 70+, primigravida, antepartum, third trimester  7. SS-A antibody positive - Results to be given to rheumatologist today (release of information form signed)   Term labor symptoms and general obstetric precautions including but not limited to vaginal bleeding, contractions, leaking of fluid and fetal movement were reviewed in detail with the patient. Please refer to After Visit Summary for other counseling recommendations.  Return in about 4 days (around 06/13/2015) for NST and Thurs NST and appt.   Eino Farber Kennith Gain, CNM

## 2015-06-09 NOTE — Progress Notes (Signed)
Cultures today Breastfeeding tip of the week reviewed, pt is not going to breastfeed due to medication  Pt has been coughing for the past 2 wks, is concerned

## 2015-06-09 NOTE — Patient Instructions (Addendum)
Safe Medications in Pregnancy    Acne: Benzoyl Peroxide Salicylic Acid  Backache/Headache: Tylenol: 2 regular strength every 4 hours OR              2 Extra strength every 6 hours  Colds/Coughs/Allergies: Benadryl (alcohol free) 25 mg every 6 hours as needed Breath right strips Claritin Cepacol throat lozenges Chloraseptic throat spray Cold-Eeze- up to three times per day Cough drops, alcohol free Flonase (by prescription only) Guaifenesin Mucinex Robitussin DM (plain only, alcohol free) Saline nasal spray/drops Sudafed (pseudoephedrine) & Actifed ** use only after [redacted] weeks gestation and if you do not have high blood pressure Tylenol Vicks Vaporub Zinc lozenges Zyrtec   Constipation: Colace Ducolax suppositories Fleet enema Glycerin suppositories Metamucil Milk of magnesia Miralax Senokot Smooth move tea  Diarrhea: Kaopectate Imodium A-D  *NO pepto Bismol  Hemorrhoids: Anusol Anusol HC Preparation H Tucks  Indigestion: Tums Maalox Mylanta Zantac  Pepcid  Insomnia: Benadryl (alcohol free) 25mg every 6 hours as needed Tylenol PM Unisom, no Gelcaps  Leg Cramps: Tums MagGel  Nausea/Vomiting:  Bonine Dramamine Emetrol Ginger extract Sea bands Meclizine  Nausea medication to take during pregnancy:  Unisom (doxylamine succinate 25 mg tablets) Take one tablet daily at bedtime. If symptoms are not adequately controlled, the dose can be increased to a maximum recommended dose of two tablets daily (1/2 tablet in the morning, 1/2 tablet mid-afternoon and one at bedtime). Vitamin B6 100mg tablets. Take one tablet twice a day (up to 200 mg per day).  Skin Rashes: Aveeno products Benadryl cream or 25mg every 6 hours as needed Calamine Lotion 1% cortisone cream  Yeast infection: Gyne-lotrimin 7 Monistat 7   **If taking multiple medications, please check labels to avoid duplicating the same active ingredients **take  medication as directed on the label ** Do not exceed 4000 mg of tylenol in 24 hours **Do not take medications that contain aspirin or ibuprofen  AREA PEDIATRIC/FAMILY PRACTICE PHYSICIANS  [facility] [address] [place], [place] [address] Phone - [phone]   Fax - [phone]  [facility] [address] [place], [place]  [address] Phone - [phone]   Fax - [phone]  [facility] [address] [place], [place]  [address] Phone - [phone]   Fax - [phone]  [facility] [address] [place], [place]  [address] Phone - [phone]   Fax - [phone]  [facility] [address] [place], [place]  [address] Phone - [phone]   Fax - [phone]  [facility] [address] [place], [place]  [address] Phone - [phone]   Fax - [phone]  [facility] [address] [place], [place]  [address] Phone - [phone]   Fax - [phone]  [facility] [address] [place], [place]  [address] Phone - [phone]   Fax - [phone]  [facility] [address] [place], [place]  [address] Phone - [phone]   Fax - [phone] [facility] [address] [place], [place]  [address] Phone - [phone]   Fax - [phone]  [facility] [address] [place], [place]  [address] Phone - [phone]   Fax - [phone]  [facility] [address] ,   [address] Phone - [phone]   Fax - [phone]    [facility] [address] [place], [place]  [address] Phone - [phone]   Fax - [phone]  [facility] [address] [place], [place]  [address] Phone -  [phone]  [facility] [address] [place], [place]  [address] Phone - [phone]   Fax - [phone]  [facility] [address] [place], [place]  [address] Phone - [phone]   Fax - [phone]  [facility] [address] [place], [place]  [address] Phone - [phone]   Fax - [phone]  [facility] [address] [place], [place]  [address] Phone - [phone]   Fax - [phone]   [facility] [address] [place], [place]  [address] Phone - [phone]   Fax - [phone]  [facility] [address] [place], [place]  [address] Phone - [phone]   Fax - [phone]  [facility] [address] [place], [place]  [address] Phone - [phone]   Fax - [phone]  [facility] [address] [place], [place]  [address] Phone - [phone]   Fax - [phone]  [facility] [address] [place], [place]  [address] Phone - [phone]   Fax - [phone] [facility] [address] [place], [place]  [address] Phone - [phone]   Fax - [phone]  [facility] [address] [place], [place]  [address] Phone - [phone]   Fax - [phone]  [facility] [address] [place],   [address] Phone - [phone]   Fax - [phone]   

## 2015-06-10 LAB — GC/CHLAMYDIA PROBE AMP
CT PROBE, AMP APTIMA: NEGATIVE
GC PROBE AMP APTIMA: NEGATIVE

## 2015-06-10 NOTE — Progress Notes (Signed)
NST reviewed and reactive.  Asli Tokarski L. Harraway-Smith, M.D., FACOG    

## 2015-06-11 LAB — CULTURE, BETA STREP (GROUP B ONLY)

## 2015-06-14 ENCOUNTER — Ambulatory Visit (INDEPENDENT_AMBULATORY_CARE_PROVIDER_SITE_OTHER): Payer: 59 | Admitting: *Deleted

## 2015-06-14 VITALS — BP 115/84 | HR 82

## 2015-06-14 DIAGNOSIS — M3500 Sicca syndrome, unspecified: Secondary | ICD-10-CM

## 2015-06-14 NOTE — Progress Notes (Signed)
NST reactive.

## 2015-06-16 ENCOUNTER — Ambulatory Visit (HOSPITAL_COMMUNITY)
Admission: RE | Admit: 2015-06-16 | Discharge: 2015-06-16 | Disposition: A | Discharge status: Home / Self Care | Payer: 59 | Source: Ambulatory Visit | Attending: Obstetrics & Gynecology | Admitting: Obstetrics & Gynecology

## 2015-06-16 ENCOUNTER — Encounter: Payer: 59 | Admitting: Obstetrics & Gynecology

## 2015-06-16 ENCOUNTER — Ambulatory Visit (INDEPENDENT_AMBULATORY_CARE_PROVIDER_SITE_OTHER): Payer: 59 | Admitting: Obstetrics & Gynecology

## 2015-06-16 VITALS — BP 120/74 | HR 74 | Wt 153.0 lb

## 2015-06-16 DIAGNOSIS — M3500 Sicca syndrome, unspecified: Secondary | ICD-10-CM | POA: Diagnosis not present

## 2015-06-16 DIAGNOSIS — O0993 Supervision of high risk pregnancy, unspecified, third trimester: Secondary | ICD-10-CM

## 2015-06-16 DIAGNOSIS — Z20828 Contact with and (suspected) exposure to other viral communicable diseases: Secondary | ICD-10-CM | POA: Diagnosis not present

## 2015-06-16 DIAGNOSIS — O99891 Other specified diseases and conditions complicating pregnancy: Secondary | ICD-10-CM

## 2015-06-16 DIAGNOSIS — O9989 Other specified diseases and conditions complicating pregnancy, childbirth and the puerperium: Secondary | ICD-10-CM

## 2015-06-16 LAB — POCT URINALYSIS DIP (DEVICE)
BILIRUBIN URINE: NEGATIVE
GLUCOSE, UA: NEGATIVE mg/dL
Hgb urine dipstick: NEGATIVE
Ketones, ur: NEGATIVE mg/dL
NITRITE: NEGATIVE
Protein, ur: NEGATIVE mg/dL
Specific Gravity, Urine: 1.01 (ref 1.005–1.030)
UROBILINOGEN UA: 0.2 mg/dL (ref 0.0–1.0)
pH: 6 (ref 5.0–8.0)

## 2015-06-16 NOTE — Progress Notes (Signed)
Subjective:  Jillian Macias is a 39 y.o. G3P0020 at [redacted]w[redacted]d being seen today for ongoing prenatal care.  Patient reports that she traveled to [place] on the week on June 26 for her baby shower and did spend one day in the neighborhood, and got one mosquito bite.  Concerned about Zika exposure.  No symptoms of fever or rash.  Contractions: Not present.  Vag. Bleeding: None. Movement: Present. Denies leaking of fluid.   The following portions of the patient's history were reviewed and updated as appropriate: allergies, current medications, past family history, past medical history, past social history, past surgical history and problem list.   Objective:   Filed Vitals:   06/16/15 0748  BP: 120/74  Pulse: 74  Weight: 153 lb (69.4 kg)    Fetal Status: Fetal Heart Rate (bpm): NST Fundal Height: 38 cm Movement: Present     General:  Alert, oriented and cooperative. Patient is in no acute distress.  Skin: Skin is warm and dry. No rash noted.   Cardiovascular: Normal heart rate noted  Respiratory: Normal respiratory effort, no problems with respiration noted  Abdomen: Soft, gravid, appropriate for gestational age. Pain/Pressure: Present     Vaginal: Vag. Bleeding: None.       Cervix: Not evaluated        Extremities: Normal range of motion.  Edema: Trace  Mental Status: Normal mood and affect. Normal behavior. Normal judgment and thought content.   Urinalysis: Urine Protein: Negative Urine Glucose: Negative  Assessment and Plan:  Pregnancy: G3P0020 at [redacted]w[redacted]d 1. Concern about possible Zika exposure: Will run tests for Zika virus today.  Given that the turn around time of the tests can be over a week and delivery is going to be soon; patient is very anxious. Will obtain ultrasound to evaluate fetus and hopefully reassure patient.  Will follow up all results and manage accordingly.  2. Sjogren's disease/ Supervision of high risk pregnancy, antepartum, third trimester NST performed today was  reviewed and was found to be reactive.  Continue recommended antenatal testing and prenatal care. IOL scheduled 06/23/15; patient has birth plan which will be reviewed upon admission by admitting team Term labor symptoms and general obstetric precautions including but not limited to vaginal bleeding, contractions, leaking of fluid and fetal movement were reviewed in detail with the patient. Please refer to After Visit Summary for other counseling recommendations.  Return in about 5 days (around 06/21/2015) for 2x/wk as scheduled. IOL on 06/23/15.   Tereso Newcomer, MD

## 2015-06-16 NOTE — Progress Notes (Signed)
Ultrasound scheduled for 06/16/2015@4 :00PM

## 2015-06-16 NOTE — Patient Instructions (Signed)
Return to clinic for any obstetric concerns or go to MAU for evaluation  

## 2015-06-16 NOTE — Progress Notes (Signed)
IOL scheduled 8/11.

## 2015-06-17 DIAGNOSIS — O3660X0 Maternal care for excessive fetal growth, unspecified trimester, not applicable or unspecified: Secondary | ICD-10-CM | POA: Insufficient documentation

## 2015-06-20 ENCOUNTER — Telehealth (HOSPITAL_COMMUNITY): Payer: Self-pay | Admitting: *Deleted

## 2015-06-20 NOTE — Telephone Encounter (Signed)
Preadmission screen  

## 2015-06-21 ENCOUNTER — Ambulatory Visit (INDEPENDENT_AMBULATORY_CARE_PROVIDER_SITE_OTHER): Payer: 59 | Admitting: *Deleted

## 2015-06-21 VITALS — BP 122/83 | HR 81

## 2015-06-21 DIAGNOSIS — O9989 Other specified diseases and conditions complicating pregnancy, childbirth and the puerperium: Secondary | ICD-10-CM | POA: Diagnosis not present

## 2015-06-21 DIAGNOSIS — M3500 Sicca syndrome, unspecified: Secondary | ICD-10-CM | POA: Diagnosis not present

## 2015-06-21 NOTE — Progress Notes (Signed)
IOL scheduled 8/11 @ 0730

## 2015-06-22 ENCOUNTER — Encounter (HOSPITAL_COMMUNITY): Payer: Self-pay | Admitting: *Deleted

## 2015-06-22 ENCOUNTER — Telehealth (HOSPITAL_COMMUNITY): Payer: Self-pay | Admitting: *Deleted

## 2015-06-22 NOTE — Telephone Encounter (Signed)
Preadmission screen  

## 2015-06-23 ENCOUNTER — Inpatient Hospital Stay (HOSPITAL_COMMUNITY)
Admission: RE | Admit: 2015-06-23 | Discharge: 2015-06-27 | DRG: 775 | Disposition: A | Discharge status: Home / Self Care | Payer: 59 | Source: Ambulatory Visit | Attending: Obstetrics & Gynecology | Admitting: Obstetrics & Gynecology

## 2015-06-23 ENCOUNTER — Encounter (HOSPITAL_COMMUNITY): Payer: Self-pay

## 2015-06-23 DIAGNOSIS — O3663X0 Maternal care for excessive fetal growth, third trimester, not applicable or unspecified: Secondary | ICD-10-CM | POA: Diagnosis present

## 2015-06-23 DIAGNOSIS — M3500 Sicca syndrome, unspecified: Secondary | ICD-10-CM | POA: Diagnosis present

## 2015-06-23 DIAGNOSIS — Z3A39 39 weeks gestation of pregnancy: Secondary | ICD-10-CM | POA: Diagnosis present

## 2015-06-23 DIAGNOSIS — O09523 Supervision of elderly multigravida, third trimester: Secondary | ICD-10-CM

## 2015-06-23 DIAGNOSIS — O9912 Other diseases of the blood and blood-forming organs and certain disorders involving the immune mechanism complicating childbirth: Principal | ICD-10-CM | POA: Diagnosis present

## 2015-06-23 LAB — CBC
HCT: 37.7 % (ref 36.0–46.0)
Hemoglobin: 12.6 g/dL (ref 12.0–15.0)
MCH: 29.3 pg (ref 26.0–34.0)
MCHC: 33.4 g/dL (ref 30.0–36.0)
MCV: 87.7 fL (ref 78.0–100.0)
Platelets: 191 10*3/uL (ref 150–400)
RBC: 4.3 MIL/uL (ref 3.87–5.11)
RDW: 14 % (ref 11.5–15.5)
WBC: 6.1 10*3/uL (ref 4.0–10.5)

## 2015-06-23 LAB — ABO/RH: ABO/RH(D): A POS

## 2015-06-23 LAB — TYPE AND SCREEN
ABO/RH(D): A POS
Antibody Screen: NEGATIVE

## 2015-06-23 LAB — RAPID HIV SCREEN (HIV 1/2 AB+AG)
HIV 1/2 Antibodies: NONREACTIVE
HIV-1 P24 Antigen - HIV24: NONREACTIVE

## 2015-06-23 LAB — OB RESULTS CONSOLE HIV ANTIBODY (ROUTINE TESTING): HIV: NONREACTIVE

## 2015-06-23 LAB — RPR: RPR Ser Ql: NONREACTIVE

## 2015-06-23 MED ORDER — OXYTOCIN 40 UNITS IN LACTATED RINGERS INFUSION - SIMPLE MED
62.5000 mL/h | INTRAVENOUS | Status: DC
Start: 2015-06-23 — End: 2015-06-26

## 2015-06-23 MED ORDER — OXYTOCIN BOLUS FROM INFUSION: 500.0000 mL | INTRAVENOUS | Status: DC

## 2015-06-23 MED ORDER — LACTATED RINGERS IV SOLN: 500.0000 mL | INTRAVENOUS | Status: DC | PRN

## 2015-06-23 MED ORDER — OXYCODONE-ACETAMINOPHEN 5-325 MG PO TABS: 2.0000 | ORAL_TABLET | ORAL | Status: DC | PRN

## 2015-06-23 MED ORDER — ONDANSETRON HCL 4 MG/2ML IJ SOLN
4.0000 mg | Freq: Four times a day (QID) | INTRAMUSCULAR | Status: DC | PRN
  Administered 2015-06-24 – 2015-06-25 (×3): 4 mg via INTRAVENOUS
  Filled 2015-06-23 (×3): qty 2

## 2015-06-23 MED ORDER — OXYCODONE-ACETAMINOPHEN 5-325 MG PO TABS: 1.0000 | ORAL_TABLET | ORAL | Status: DC | PRN

## 2015-06-23 MED ORDER — MISOPROSTOL 200 MCG PO TABS
50.0000 ug | ORAL_TABLET | ORAL | Status: DC | PRN
  Administered 2015-06-23 (×3): 50 ug via VAGINAL
  Filled 2015-06-23 (×3): qty 0.5

## 2015-06-23 MED ORDER — LIDOCAINE HCL (PF) 1 % IJ SOLN
30.0000 mL | INTRAMUSCULAR | Status: DC | PRN
Start: 2015-06-23 — End: 2015-06-26
  Filled 2015-06-23 (×2): qty 30

## 2015-06-23 MED ORDER — HYDROXYCHLOROQUINE SULFATE 200 MG PO TABS
200.0000 mg | ORAL_TABLET | Freq: Every day | ORAL | Status: DC
  Administered 2015-06-23 – 2015-06-24 (×2): 200 mg via ORAL
  Filled 2015-06-23 (×4): qty 1

## 2015-06-23 MED ORDER — FLEET ENEMA 7-19 GM/118ML RE ENEM: 1.0000 | ENEMA | RECTAL | Status: DC | PRN

## 2015-06-23 MED ORDER — LACTATED RINGERS IV SOLN
INTRAVENOUS | Status: DC
Start: 2015-06-23 — End: 2015-06-26
  Administered 2015-06-23 – 2015-06-25 (×6): via INTRAVENOUS

## 2015-06-23 MED ORDER — CITRIC ACID-SODIUM CITRATE 334-500 MG/5ML PO SOLN: 30.0000 mL | ORAL | Status: DC | PRN

## 2015-06-23 MED ORDER — ACETAMINOPHEN 325 MG PO TABS: 650.0000 mg | ORAL_TABLET | ORAL | Status: DC | PRN

## 2015-06-23 MED ORDER — TERBUTALINE SULFATE 1 MG/ML IJ SOLN
0.2500 mg | Freq: Once | INTRAMUSCULAR | Status: DC | PRN
  Filled 2015-06-23: qty 1

## 2015-06-23 MED ORDER — LOPERAMIDE HCL 2 MG PO CAPS
2.0000 mg | ORAL_CAPSULE | ORAL | Status: DC | PRN
  Filled 2015-06-23: qty 1

## 2015-06-23 NOTE — Progress Notes (Signed)
Jillian Macias is a 39 y.o. G2P0010 at [redacted]w[redacted]d   Subjective: Mostly comfortable  Objective: BP 129/80 mmHg  Pulse 65  Temp(Src) 97.8 F (36.6 C) (Oral)  Resp 20  Ht  (1.626 m)  Wt 69.4 kg (153 lb)  BMI 26.25 kg/m2  LMP 09/21/2014      FHT:  FHR: 140s bpm, variability: moderate,  accelerations:  Present,  decelerations:  Absent UC:   irregular, every 2-5 minutes SVE:   Dilation: Fingertip Effacement (%): Thick Station: -3 Exam by:: k shaw cnm  Labs: Lab Results  Component Value Date   WBC 6.1 06/23/2015   HGB 12.6 06/23/2015   HCT 37.7 06/23/2015   MCV 87.7 06/23/2015   PLT 191 06/23/2015    Assessment / Plan: IOL process LGA  Given 3rd dose of cytotec PO due to inability to place foley bulb.  Cam Hai CNM 06/23/2015, 7:10 PM

## 2015-06-23 NOTE — H&P (Signed)
Jillian Macias is a 39 y.o. female G2P0010 @ 39.2wks by LMP and confirmed w/ 12wk scan presenting for IOL due to Sjogren's syndrome w/ SS-A antibody positive. Her preg has been followed by the [facility] and has been remarkable for 1) EFW 9+5 2) possible Zika exposure in [place] (asymptomatic, test pending) 3) AMA  History OB History    Gravida Para Term Preterm AB TAB SAB Ectopic Multiple Living   2 0 0 0 1 1 0 0       Past Medical History  Diagnosis Date  . Sjogren's syndrome    Past Surgical History  Procedure Laterality Date  . No past surgeries    . Breast surgery     Family History: family history includes Cancer in her maternal grandmother. Social History:  reports that she has never smoked. She has never used smokeless tobacco. She reports that she does not drink alcohol or use illicit drugs.   Prenatal Transfer Tool  Maternal Diabetes: No Genetic Screening: Normal Maternal Ultrasounds/Referrals: NormalEFW 9+5 Fetal Ultrasounds or other Referrals:  Fetal echo- normal x 2 Maternal Substance Abuse:  No Significant Maternal Medications:  Meds include: Other: Plaquenil Significant Maternal Lab Results:  Lab values include: Other: SS-A and SS-B pos Other Comments:  possible Zika exposure  ROS  Dilation: Closed Effacement (%): Thick Station: -3 Exam by:: h stone rnc Blood pressure 133/85, pulse 71, temperature 97.6 F (36.4 C), temperature source Oral, resp. rate 20, height  (1.626 m), weight 69.4 kg (153 lb), last menstrual period 09/21/2014, unknown if currently breastfeeding. Exam Physical Exam  Constitutional: She is oriented to person, place, and time. She appears well-developed.  HENT:  Head: Normocephalic.  Neck: Normal range of motion.  Cardiovascular: Normal rate.   Respiratory: Effort normal.  GI:  EFM +accels, no decels Rare ctx  Genitourinary: Vagina normal.  Musculoskeletal: Normal range of motion.  Neurological: She is alert and oriented to person,  place, and time.  Skin: Skin is warm and dry.  Psychiatric: She has a normal mood and affect. Her behavior is normal. Thought content normal.    Prenatal labs: ABO, Rh: --/--/A POS, A POS (08/11 0815) Antibody: NEG (08/11 0815) Rubella: Immune (01/25 0000) RPR: Nonreactive (01/25 0000)  HBsAg: Negative (01/25 0000)  HIV: Non-reactive (08/11 0000)  GBS: Negative (07/28 0000)   Assessment/Plan: IUP@39 .2wks Sjogren's syndrome LGA Possible Zika exposure Unfavorable cx  Admit to [facility] cytotec for ripening followed by foley/Pit/AROM as needed Have MD on standby during delivery   Cam Hai CNM 06/23/2015, 2:56 PM

## 2015-06-24 MED ORDER — FENTANYL CITRATE (PF) 100 MCG/2ML IJ SOLN
INTRAMUSCULAR | Status: AC
  Filled 2015-06-24: qty 2

## 2015-06-24 MED ORDER — PHENYLEPHRINE 40 MCG/ML (10ML) SYRINGE FOR IV PUSH (FOR BLOOD PRESSURE SUPPORT)
80.0000 ug | PREFILLED_SYRINGE | INTRAVENOUS | Status: DC | PRN
  Filled 2015-06-24: qty 2
  Filled 2015-06-24: qty 20

## 2015-06-24 MED ORDER — HYDROXYZINE HCL 50 MG PO TABS
50.0000 mg | ORAL_TABLET | Freq: Four times a day (QID) | ORAL | Status: DC | PRN
  Administered 2015-06-24: 50 mg via ORAL
  Filled 2015-06-24 (×2): qty 1

## 2015-06-24 MED ORDER — FENTANYL CITRATE (PF) 100 MCG/2ML IJ SOLN
100.0000 ug | INTRAMUSCULAR | Status: DC | PRN
  Administered 2015-06-24 (×2): 100 ug via INTRAVENOUS
  Filled 2015-06-24: qty 2

## 2015-06-24 MED ORDER — DIPHENHYDRAMINE HCL 50 MG/ML IJ SOLN: 12.5000 mg | INTRAMUSCULAR | Status: DC | PRN

## 2015-06-24 MED ORDER — BUTORPHANOL TARTRATE 1 MG/ML IJ SOLN
2.0000 mg | Freq: Once | INTRAMUSCULAR | Status: AC
  Administered 2015-06-24: 2 mg via INTRAVENOUS
  Filled 2015-06-24: qty 2

## 2015-06-24 MED ORDER — FENTANYL 2.5 MCG/ML BUPIVACAINE 1/10 % EPIDURAL INFUSION (WH - ANES)
14.0000 mL/h | INTRAMUSCULAR | Status: DC | PRN
  Administered 2015-06-25 (×2): 14 mL/h via EPIDURAL
  Filled 2015-06-24 (×2): qty 125

## 2015-06-24 MED ORDER — NALBUPHINE HCL 10 MG/ML IJ SOLN: 10.0000 mg | INTRAMUSCULAR | Status: DC | PRN

## 2015-06-24 MED ORDER — FENTANYL CITRATE (PF) 100 MCG/2ML IJ SOLN: 50.0000 ug | INTRAMUSCULAR | Status: DC | PRN

## 2015-06-24 MED ORDER — OXYTOCIN 40 UNITS IN LACTATED RINGERS INFUSION - SIMPLE MED
1.0000 m[IU]/min | INTRAVENOUS | Status: DC
  Administered 2015-06-24: 2 m[IU]/min via INTRAVENOUS
  Filled 2015-06-24: qty 1000

## 2015-06-24 MED ORDER — EPHEDRINE 5 MG/ML INJ
10.0000 mg | INTRAVENOUS | Status: DC | PRN
  Filled 2015-06-24: qty 2

## 2015-06-24 MED ORDER — MISOPROSTOL 25 MCG QUARTER TABLET
25.0000 ug | ORAL_TABLET | ORAL | Status: DC
  Administered 2015-06-24: 25 ug via VAGINAL
  Filled 2015-06-24 (×4): qty 1
  Filled 2015-06-24: qty 0.25
  Filled 2015-06-24 (×2): qty 1

## 2015-06-24 NOTE — Progress Notes (Signed)
Patient ID: Jillian Macias, female   DOB: [birth year], 39 y.o.   MRN: [id number] Jillian Macias is a 40 y.o. G2P0010 at [redacted]w[redacted]d.  Subjective: More uncomfortable w/ UC's. Asking for IV pain meds,   Objective: BP 125/89 mmHg  Pulse 68  Temp(Src) 97.9 F (36.6 C) (Oral)  Resp 18  Ht  (1.626 m)  Wt 153 lb (69.4 kg)  BMI 26.25 kg/m2  LMP 09/21/2014   FHT:  FHR: 140 bpm, variability: mod,  accelerations:  15x15,  decelerations:  Mild variables UC:   Q 2-5 minutes, moderate  Dilation: 1 Effacement (%): 70 Cervical Position: Posterior Station: -3 Presentation: Vertex Exam by:: Katrinka Blazing, CNM Foley bulb placed w/out difficulty  Labs: No results found for this or any previous visit (from the past 24 hour(s)).  Assessment / Plan: [redacted]w[redacted]d week IUP Labor: Early Fetal Wellbeing:  Category I Pain Control:  Fentanyl Anticipated MOD:  SVD Pit at 2 while foley in place, then advance.  [place], CNM 06/24/2015 9:42 AM

## 2015-06-24 NOTE — Progress Notes (Signed)
Patient ID: Jillian Macias, female   DOB: [birth year], 39 y.o.   MRN: [id number] Jillian Macias is a 39 y.o. G2P0010 at [redacted]w[redacted]d.  Subjective: Moderate discomfort w/ UC's since MN. Very anxious that she hasn't delivered yet. Asking if IOL is necessary.   Objective: BP 130/89 mmHg  Pulse 70  Temp(Src) 97.8 F (36.6 C) (Oral)  Resp 18  Ht  (1.626 m)  Wt 153 lb (69.4 kg)  BMI 26.25 kg/m2  LMP 09/21/2014   FHT:  FHR: 140 bpm, variability: mod,  accelerations:  15x15,  decelerations:  none UC:   Q 2-3 minutes, moderate  Dilation: Fingertip Effacement (%): Thick Cervical Position: Posterior Station: -3 Presentation: Vertex per [place] Very soft, to maternal left Exam by:: Dorathy Kinsman, cnm  Labs: Results for orders placed or performed during the hospital encounter of 06/23/15 (from the past 24 hour(s))  CBC     Status: None   Collection Time: 06/23/15  8:15 AM  Result Value Ref Range   WBC 6.1 4.0 - 10.5 K/uL   RBC 4.30 3.87 - 5.11 MIL/uL   Hemoglobin 12.6 12.0 - 15.0 g/dL   HCT 40.9 81.1 - 91.4 %   MCV 87.7 78.0 - 100.0 fL   MCH 29.3 26.0 - 34.0 pg   MCHC 33.4 30.0 - 36.0 g/dL   RDW 78.2 95.6 - 21.3 %   Platelets 191 150 - 400 K/uL  RPR     Status: None   Collection Time: 06/23/15  8:15 AM  Result Value Ref Range   RPR Ser Ql Non Reactive Non Reactive  Type and screen     Status: None   Collection Time: 06/23/15  8:15 AM  Result Value Ref Range   ABO/RH(D) A POS    Antibody Screen NEG    Sample Expiration 06/26/2015   Rapid HIV screen (HIV 1/2 Ab+Ag)     Status: None   Collection Time: 06/23/15  8:15 AM  Result Value Ref Range   HIV-1 P24 Antigen - HIV24 NON REACTIVE NON REACTIVE   HIV 1/2 Antibodies NON REACTIVE NON REACTIVE   Interpretation (HIV Ag Ab)      A non reactive test result means that HIV 1 or HIV 2 antibodies and HIV 1 p24 antigen were not detected in the specimen.  ABO/Rh     Status: None   Collection Time: 06/23/15  8:15 AM  Result Value Ref Range   ABO/RH(D) A POS     Assessment / Plan: [redacted]w[redacted]d week IUP week IUP Labor: Early/IOL--not contracting regularly and uncomfortably Fetal Wellbeing:  Category I Pain Control:  Fentanyl Anticipated MOD:  SVD Lengthy conversation w/ pt and family about indication for IOL and normal course of IOL for Primip w/ unripe cervix. Reassured that IOL os progressing normally. Encouraged that pt is now contracting uncomfortably.  Discussed options:   1. Expectant management since contracting now (reccomended)  2. Cytotec (Unable to repeat now 2/2 UC's too close and strong)  3. Trying foley bulb w/ cervix not quite 1 cm (doubt pt would tolerate due to extreme intolerance of exams)  Will manage expectantly for now. Pt and family verbalize understanding of indication for IOL, that decisions must be made on a exam-by-exam basis and that IOL is progressing normally.     [place], CNM 06/24/2015 1:53 AM

## 2015-06-25 ENCOUNTER — Inpatient Hospital Stay (HOSPITAL_COMMUNITY): Payer: 59 | Admitting: Anesthesiology

## 2015-06-25 ENCOUNTER — Encounter (HOSPITAL_COMMUNITY): Payer: Self-pay

## 2015-06-25 DIAGNOSIS — O3663X0 Maternal care for excessive fetal growth, third trimester, not applicable or unspecified: Secondary | ICD-10-CM

## 2015-06-25 DIAGNOSIS — M3500 Sicca syndrome, unspecified: Secondary | ICD-10-CM | POA: Diagnosis not present

## 2015-06-25 DIAGNOSIS — Z3A39 39 weeks gestation of pregnancy: Secondary | ICD-10-CM

## 2015-06-25 MED ORDER — IBUPROFEN 600 MG PO TABS
600.0000 mg | ORAL_TABLET | Freq: Four times a day (QID) | ORAL | Status: DC
  Administered 2015-06-25 – 2015-06-27 (×7): 600 mg via ORAL
  Filled 2015-06-25 (×7): qty 1

## 2015-06-25 MED ORDER — LIDOCAINE HCL (PF) 1 % IJ SOLN
INTRAMUSCULAR | Status: DC | PRN
  Administered 2015-06-25 (×2): 8 mL via EPIDURAL

## 2015-06-25 MED ORDER — BUTORPHANOL TARTRATE 1 MG/ML IJ SOLN
1.0000 mg | Freq: Once | INTRAMUSCULAR | Status: AC
  Administered 2015-06-25: 1 mg via INTRAVENOUS
  Filled 2015-06-25: qty 1

## 2015-06-25 MED ORDER — LORAZEPAM 2 MG/ML IJ SOLN: 0.5000 mg | INTRAMUSCULAR | Status: DC | PRN

## 2015-06-25 NOTE — Progress Notes (Addendum)
Jillian Macias is a 39 y.o. G2P0010 at [redacted]w[redacted]d   Subjective: Rested during the night; foley still in place; premedicated w/ Stadol ; had Pitocin running at 28mu/min during the night  Objective: BP 101/61 mmHg  Pulse 60  Temp(Src) 98 F (36.7 C) (Oral)  Resp 18  Ht  (1.626 m)  Wt 69.4 kg (153 lb)  BMI 26.25 kg/m2  LMP 09/21/2014      FHT:  FHR: 130s bpm, variability: moderate,  accelerations:  Present,  decelerations:  Absent UC:   irregular, every 3-7 minutes SVE:   Dilation: 3.5 Effacement (%): 50 Station: Ballotable Exam by:: kim Icesis Renn cnm- foley had descended to upper vag vault; removed  Labs: Lab Results  Component Value Date   WBC 6.1 06/23/2015   HGB 12.6 06/23/2015   HCT 37.7 06/23/2015   MCV 87.7 06/23/2015   PLT 191 06/23/2015    Assessment / Plan: IOL process Sjogren's Possible Zika exposure Cx now favorable  Will allow to have a light breakfast and then begin increasing Pitocin Epidural prn  Crytal Pensinger CNM 06/25/2015, 8:54 AM

## 2015-06-25 NOTE — Progress Notes (Signed)
Patient ID: Jillian Macias, female   DOB: 01/26/1976, 39 y.o.   MRN: [id number] Jillian Macias is a 39 y.o. G2P0010 at [redacted]w[redacted]d admitted for induction of labor due to sjogren's syndrome.  Subjective: Comfortable w/ epidural, no complaints   Objective: BP 133/74 mmHg  Pulse 82  Temp(Src) 98 F (36.7 C) (Oral)  Resp 18  Ht  (1.626 m)  Wt 69.4 kg (153 lb)  BMI 26.25 kg/m2  LMP 09/21/2014    FHT:  FHR: 135 bpm, variability: moderate,  accelerations:  Present,  decelerations:  Absent UC:   regular, every 2-3 minutes  SVE:   Dilation: 5.5 Effacement (%): 80 Station: -2 Exam by:: kim Cay Kath, CNM AROM large amt clear fluid  Pitocin @ 14 mu/min  Labs: Lab Results  Component Value Date   WBC 6.1 06/23/2015   HGB 12.6 06/23/2015   HCT 37.7 06/23/2015   MCV 87.7 06/23/2015   PLT 191 06/23/2015    Assessment / Plan: Induction of labor due to sjorgen's syndrome,  progressing well on pitocin  Labor: Progressing normally Fetal Wellbeing:  Category I Pain Control:  Epidural Pre-eclampsia: n/a I/D:  n/a Anticipated MOD:  NSVD  Marge Duncans CNM, WHNP-BC 06/25/2015, [phone]

## 2015-06-25 NOTE — Progress Notes (Signed)
Patient ID: Jillian Macias, female   DOB: 07-26-1976, 39 y.o.   MRN: [id number] Antha Niday is a 39 y.o. G2P0010 at [redacted]w[redacted]d admitted for induction of labor due to Sjogren's syndrome.  Subjective: Comfortable with epidural and pushing.  Objective: BP 129/71 mmHg  Pulse 79  Temp(Src) 98.4 F (36.9 C) (Oral)  Resp 18  Ht  (1.626 m)  Wt 153 lb (69.4 kg)  BMI 26.25 kg/m2  LMP 09/21/2014   FHT:  FHR: 150 bpm, variability: moderate,  accelerations:  Present,  decelerations:  Mild variable UC:   regular, every 2-3 minutes  SVE:   Dilation: 10 Dilation Complete Date: 06/25/15 Dilation Complete Time: 1944 Effacement (%): 100 Cervical Position: Middle Station: +3 Presentation: Vertex Exam by:: Laticha Ferrucci   Labs: Lab Results  Component Value Date   WBC 6.1 06/23/2015   HGB 12.6 06/23/2015   HCT 37.7 06/23/2015   MCV 87.7 06/23/2015   PLT 191 06/23/2015    Assessment / Plan: Induction of labor due to sjorgen's syndrome, now in second stage of labor.   Labor: Will continue pushing.  Continue pitocin per protocol Fetal Wellbeing:  Category I Pain Control:  Epidural Anticipated MOD:  Anticipate NSVD soon    Alajia Schmelzer A , MD

## 2015-06-25 NOTE — Anesthesia Preprocedure Evaluation (Signed)

## 2015-06-25 NOTE — Progress Notes (Signed)
Patient ID: Jillian Macias, female   DOB: Apr 10, 1976, 39 y.o.   MRN: [id number] Jillian Macias is a 39 y.o. G2P0010 at [redacted]w[redacted]d admitted for induction of labor due to sjogren's syndrome.  Subjective: Comfortable w/ epidural.  Complains about length of induction (has been here since 06/23/15) and feels induction plans are not being done expediently.    Objective: BP 131/71 mmHg  Pulse 85  Temp(Src) 98.4 F (36.9 C) (Oral)  Resp 20  Ht  (1.626 m)  Wt 153 lb (69.4 kg)  BMI 26.25 kg/m2  LMP 09/21/2014   FHT:  FHR: 135 bpm, variability: moderate,  accelerations:  Present,  decelerations:  Absent UC:   regular, every 2-3 minutes  SVE:   Dilation: 10 Effacement (%): 100 Station: 0 Exam by:: Abiha Lukehart  Labs: Lab Results  Component Value Date   WBC 6.1 06/23/2015   HGB 12.6 06/23/2015   HCT 37.7 06/23/2015   MCV 87.7 06/23/2015   PLT 191 06/23/2015    Assessment / Plan: Induction of labor due to sjorgen's syndrome, now in second stage of labor.  She is reassured by this change.  Labor: Will start pushing.  Continue pitocin per protocol Fetal Wellbeing:  Category I Pain Control:  Epidural Anticipated MOD:  Hopeful NSVD    Jillian Macias A , MD

## 2015-06-25 NOTE — Anesthesia Procedure Notes (Signed)
Epidural Patient location during procedure: OB Start time: 06/25/2015 9:46 AM End time: 06/25/2015 9:50 AM  Staffing Anesthesiologist: Leilani Able  Preanesthetic Checklist Completed: patient identified, surgical consent, pre-op evaluation, timeout performed, IV checked, risks and benefits discussed and monitors and equipment checked  Epidural Patient position: sitting Prep: site prepped and draped and DuraPrep Patient monitoring: continuous pulse ox and blood pressure Approach: midline Location: L3-L4 Injection technique: LOR air  Needle:  Needle type: Tuohy  Needle gauge: 17 G Needle length: 9 cm and 9 Needle insertion depth: 5 cm cm Catheter type: closed end flexible Catheter size: 19 Gauge Catheter at skin depth: 10 cm Test dose: negative and Other  Assessment Sensory level: T9 Events: blood not aspirated, injection not painful, no injection resistance, negative IV test and no paresthesia  Additional Notes Reason for block:procedure for pain

## 2015-06-25 NOTE — Progress Notes (Signed)
Patient ID: Krisi Azua, female   DOB: 1976/01/28, 39 y.o.   MRN: [id number] Levonne Carreras is a 39 y.o. G2P0010 at [redacted]w[redacted]d admitted for induction of labor due to Sjogren's syndrome.  Subjective: Comfortable w/ epidural, no complaints.   Objective: BP 125/76 mmHg  Pulse 65  Temp(Src) 98 F (36.7 C) (Oral)  Resp 18  Ht  (1.626 m)  Wt 69.4 kg (153 lb)  BMI 26.25 kg/m2  LMP 09/21/2014    FHT:  FHR: 135 bpm, variability: moderate,  accelerations:  Present,  decelerations:  Absent UC:   2-5  SVE:  4.5/60/-2, vtx Pitocin @ 10 mu/min  Labs: Lab Results  Component Value Date   WBC 6.1 06/23/2015   HGB 12.6 06/23/2015   HCT 37.7 06/23/2015   MCV 87.7 06/23/2015   PLT 191 06/23/2015    Assessment / Plan: Induction of labor due to sjorgen's syndrome,  progressing well on pitocin  Labor: Progressing normally Fetal Wellbeing:  Category I Pain Control:  Epidural Pre-eclampsia: n/a I/D:  n/a Anticipated MOD:  NSVD  Marge Duncans CNM, WHNP-BC 06/25/2015, 12:59 PM

## 2015-06-25 NOTE — Progress Notes (Signed)
Patient ID: Jillian Macias, female   DOB: Feb 10, 1976, 39 y.o.   MRN: [id number] Jillian Macias is a 39 y.o. G2P0010 at [redacted]w[redacted]d admitted for induction of labor due to Sjogren's syndrome.  Subjective: Comfortable w/ epidural and pushing   Objective: BP 131/71 mmHg  Pulse 85  Temp(Src) 98.4 F (36.9 C) (Oral)  Resp 20  Ht  (1.626 m)  Wt 153 lb (69.4 kg)  BMI 26.25 kg/m2  LMP 09/21/2014   FHT:  FHR: 150 bpm, variability: moderate,  accelerations:  Present,  decelerations:  Mild variable UC:   regular, every 2-3 minutes  SVE:   Dilation: 10 Dilation Complete Date: 06/25/15 Dilation Complete Time: 1944 Effacement (%): 100 Cervical Position: Middle Station: +2 Presentation: Vertex Exam by:: Brynnan Rodenbaugh   Labs: Lab Results  Component Value Date   WBC 6.1 06/23/2015   HGB 12.6 06/23/2015   HCT 37.7 06/23/2015   MCV 87.7 06/23/2015   PLT 191 06/23/2015    Assessment / Plan: Induction of labor due to sjorgen's syndrome, now in second stage of labor.  She is reassured by this change.  Labor: Will continue pushing.  Continue pitocin per protocol Fetal Wellbeing:  Category I Pain Control:  Epidural Anticipated MOD:  Hopeful NSVD    Nochum Fenter A , MD

## 2015-06-26 ENCOUNTER — Encounter (HOSPITAL_COMMUNITY): Payer: Self-pay

## 2015-06-26 MED ORDER — BISACODYL 10 MG RE SUPP: 10.0000 mg | Freq: Every day | RECTAL | Status: DC | PRN

## 2015-06-26 MED ORDER — SODIUM CHLORIDE 0.9 % IJ SOLN: 3.0000 mL | INTRAMUSCULAR | Status: DC | PRN

## 2015-06-26 MED ORDER — ACETAMINOPHEN 325 MG PO TABS: 650.0000 mg | ORAL_TABLET | ORAL | Status: DC | PRN

## 2015-06-26 MED ORDER — PRENATAL MULTIVITAMIN CH
1.0000 | ORAL_TABLET | Freq: Every day | ORAL | Status: DC
  Administered 2015-06-26 – 2015-06-27 (×2): 1 via ORAL
  Filled 2015-06-26 (×2): qty 1

## 2015-06-26 MED ORDER — LANOLIN HYDROUS EX OINT: TOPICAL_OINTMENT | CUTANEOUS | Status: DC | PRN

## 2015-06-26 MED ORDER — SODIUM CHLORIDE 0.9 % IJ SOLN: 3.0000 mL | Freq: Two times a day (BID) | INTRAMUSCULAR | Status: DC

## 2015-06-26 MED ORDER — WITCH HAZEL-GLYCERIN EX PADS: 1.0000 "application " | MEDICATED_PAD | CUTANEOUS | Status: DC | PRN

## 2015-06-26 MED ORDER — MEASLES, MUMPS & RUBELLA VAC ~~LOC~~ INJ
0.5000 mL | INJECTION | Freq: Once | SUBCUTANEOUS | Status: DC
Start: 2015-06-26 — End: 2015-06-26

## 2015-06-26 MED ORDER — BENZOCAINE-MENTHOL 20-0.5 % EX AERO
1.0000 "application " | INHALATION_SPRAY | CUTANEOUS | Status: DC | PRN
  Administered 2015-06-26: 1 via TOPICAL
  Filled 2015-06-26: qty 56

## 2015-06-26 MED ORDER — FLEET ENEMA 7-19 GM/118ML RE ENEM: 1.0000 | ENEMA | Freq: Every day | RECTAL | Status: DC | PRN

## 2015-06-26 MED ORDER — SENNOSIDES-DOCUSATE SODIUM 8.6-50 MG PO TABS
2.0000 | ORAL_TABLET | ORAL | Status: DC
  Administered 2015-06-26 – 2015-06-27 (×2): 2 via ORAL
  Filled 2015-06-26 (×2): qty 2

## 2015-06-26 MED ORDER — SODIUM CHLORIDE 0.9 % IV SOLN: 250.0000 mL | INTRAVENOUS | Status: DC | PRN

## 2015-06-26 MED ORDER — ZOLPIDEM TARTRATE 5 MG PO TABS: 5.0000 mg | ORAL_TABLET | Freq: Every evening | ORAL | Status: DC | PRN

## 2015-06-26 MED ORDER — SIMETHICONE 80 MG PO CHEW
80.0000 mg | CHEWABLE_TABLET | ORAL | Status: DC | PRN
  Administered 2015-06-26: 80 mg via ORAL
  Filled 2015-06-26: qty 1

## 2015-06-26 MED ORDER — DIPHENHYDRAMINE HCL 25 MG PO CAPS: 25.0000 mg | ORAL_CAPSULE | Freq: Four times a day (QID) | ORAL | Status: DC | PRN

## 2015-06-26 MED ORDER — OXYCODONE-ACETAMINOPHEN 5-325 MG PO TABS: 2.0000 | ORAL_TABLET | ORAL | Status: DC | PRN

## 2015-06-26 MED ORDER — DIBUCAINE 1 % RE OINT: 1.0000 "application " | TOPICAL_OINTMENT | RECTAL | Status: DC | PRN

## 2015-06-26 MED ORDER — ONDANSETRON HCL 4 MG PO TABS: 4.0000 mg | ORAL_TABLET | ORAL | Status: DC | PRN

## 2015-06-26 MED ORDER — TETANUS-DIPHTH-ACELL PERTUSSIS 5-2.5-18.5 LF-MCG/0.5 IM SUSP: 0.5000 mL | Freq: Once | INTRAMUSCULAR | Status: DC

## 2015-06-26 MED ORDER — OXYTOCIN 40 UNITS IN LACTATED RINGERS INFUSION - SIMPLE MED: 62.5000 mL/h | INTRAVENOUS | Status: DC | PRN

## 2015-06-26 MED ORDER — OXYCODONE-ACETAMINOPHEN 5-325 MG PO TABS
1.0000 | ORAL_TABLET | ORAL | Status: DC | PRN
  Administered 2015-06-26: 1 via ORAL
  Filled 2015-06-26 (×2): qty 1

## 2015-06-26 MED ORDER — HYDROXYCHLOROQUINE SULFATE 200 MG PO TABS
200.0000 mg | ORAL_TABLET | Freq: Every day | ORAL | Status: DC
  Administered 2015-06-26 – 2015-06-27 (×2): 200 mg via ORAL
  Filled 2015-06-26 (×2): qty 1

## 2015-06-26 MED ORDER — ONDANSETRON HCL 4 MG/2ML IJ SOLN: 4.0000 mg | INTRAMUSCULAR | Status: DC | PRN

## 2015-06-26 NOTE — Progress Notes (Signed)
Post Partum Day 1 Subjective: Eating, drinking, voiding, ambulating well.  Lochia and pain wnl.  Denies dizziness, lightheadedness, or sob. No complaints.   Objective: Blood pressure 120/75, pulse 75, temperature 98.4 F (36.9 C), temperature source Oral, resp. rate 18, height  (1.626 m), weight 69.4 kg (153 lb), last menstrual period 09/21/2014, SpO2 99 %, unknown if currently breastfeeding.  Physical Exam:  General: alert, cooperative and no distress Lochia: appropriate Uterine Fundus: firm Incision: n/a DVT Evaluation: No evidence of DVT seen on physical exam. Negative Homan's sign. No cords or calf tenderness. Bottlefeeding d/t Plaquenil/Sjorgen's   Recent Labs  06/23/15 0815  HGB 12.6  HCT 37.7    Assessment/Plan: Plan for discharge tomorrow and Contraception COCs   LOS: 3 days   Marge Duncans 06/26/2015, 7:38 AM

## 2015-06-26 NOTE — Progress Notes (Signed)
Patient left hospital for approximately 1 hour without notifying staff to go home and "get a few things" with her fiance.  Grandmother with baby in room the whole time.  Patient notified by grandmother/mother to return to hospital ASAP.

## 2015-06-26 NOTE — Anesthesia Postprocedure Evaluation (Signed)
  Anesthesia Post-op Note  Patient: Jillian Macias  Procedure(s) Performed: * No procedures listed *  Patient Location: [facility] Unit  Anesthesia Type:Regional  Level of Consciousness: awake, alert  and oriented  Airway and Oxygen Therapy: Patient Spontanous Breathing  Post-op Pain: none  Post-op Assessment: Post-op Vital signs reviewed and Patient's Cardiovascular Status Stable              Post-op Vital Signs: Reviewed and stable  Last Vitals:  Filed Vitals:   06/26/15 0500  BP: 120/75  Pulse: 75  Temp: 36.9 C  Resp: 18    Complications: No apparent anesthesia complications

## 2015-06-27 MED ORDER — IBUPROFEN 600 MG PO TABS: 600.0000 mg | ORAL_TABLET | Freq: Four times a day (QID) | ORAL | Status: DC

## 2015-06-27 NOTE — Discharge Summary (Signed)
Obstetric Discharge Summary Reason for Admission: onset of labor Prenatal Procedures: ultrasound Intrapartum Procedures: spontaneous vaginal delivery Postpartum Procedures: none Complications-Operative and Postpartum: none HEMOGLOBIN  Date Value Ref Range Status  06/23/2015 12.6 12.0 - 15.0 g/dL Final  16/08/9603 54.0 g/dL Final  98/09/9146 82.9* 12.2 - 16.2 g/dL Final   HCT  Date Value Ref Range Status  06/23/2015 37.7 36.0 - 46.0 % Final  12/06/2014 35 % Final   HCT, POC  Date Value Ref Range Status  04/20/2013 37.0* 37.7 - 47.9 % Final    Physical Exam:  General: alert, cooperative, appears stated age and no distress Lochia: appropriate Uterine Fundus: firm Incision: n/a DVT Evaluation: No evidence of DVT seen on physical exam. Negative Homan's sign. No cords or calf tenderness.  Discharge Diagnoses: Term Pregnancy-delivered  Discharge Information: Date: 06/27/2015 Activity: pelvic rest Diet: routine Medications: PNV and Ibuprofen Condition: stable and improved Instructions: refer to practice specific booklet Discharge to: home   Newborn Data: Live born female  Birth Weight: 7 lb 10 oz (3459 g) APGAR: 9, 9  Home with mother.  Jillian Macias 06/27/2015, 7:22 AM

## 2015-06-30 ENCOUNTER — Encounter: Payer: Self-pay | Admitting: *Deleted

## 2015-06-30 DIAGNOSIS — Z20828 Contact with and (suspected) exposure to other viral communicable diseases: Secondary | ICD-10-CM

## 2015-06-30 NOTE — Progress Notes (Signed)
Attempted to contact patient but voicemail box was full.  Sent My Chart message to let her know Zika test results are negative.

## 2015-08-04 ENCOUNTER — Ambulatory Visit: Payer: Self-pay | Admitting: Family Medicine

## 2015-08-05 ENCOUNTER — Ambulatory Visit (INDEPENDENT_AMBULATORY_CARE_PROVIDER_SITE_OTHER): Payer: Self-pay | Admitting: Student

## 2015-08-05 ENCOUNTER — Encounter: Payer: Self-pay | Admitting: Student

## 2015-08-05 VITALS — Temp 98.5°F | Ht 64.5 in | Wt 131.1 lb

## 2015-08-05 DIAGNOSIS — N898 Other specified noninflammatory disorders of vagina: Secondary | ICD-10-CM

## 2015-08-05 LAB — WET PREP, GENITAL
Clue Cells Wet Prep HPF POC: NONE SEEN
Trich, Wet Prep: NONE SEEN
Yeast Wet Prep HPF POC: NONE SEEN

## 2015-08-05 LAB — POCT PREGNANCY, URINE: Preg Test, Ur: NEGATIVE

## 2015-08-05 MED ORDER — NORGESTIM-ETH ESTRAD TRIPHASIC 0.18/0.215/0.25 MG-25 MCG PO TABS: 1.0000 | ORAL_TABLET | Freq: Every day | ORAL | Status: DC

## 2015-08-05 NOTE — Patient Instructions (Signed)
Oral Contraception Information Oral contraceptive pills (OCPs) are medicines taken to prevent pregnancy. OCPs work by preventing the ovaries from releasing eggs. The hormones in OCPs also cause the cervical mucus to thicken, preventing the sperm from entering the uterus. The hormones also cause the uterine lining to become thin, not allowing a fertilized egg to attach to the inside of the uterus. OCPs are highly effective when taken exactly as prescribed. However, OCPs do not prevent sexually transmitted diseases (STDs). Safe sex practices, such as using condoms along with the pill, can help prevent STDs.  Before taking the pill, you may have a physical exam and Pap test. Your health care Rivky Clendenning may order blood tests. The health care Abbegayle Denault will make sure you are a good candidate for oral contraception. Discuss with your health care Dawsyn Zurn the possible side effects of the OCP you may be prescribed. When starting an OCP, it can take 2 to 3 months for the body to adjust to the changes in hormone levels in your body.  TYPES OF ORAL CONTRACEPTION  The combination pill--This pill contains estrogen and progestin (synthetic progesterone) hormones. The combination pill comes in 21-day, 28-day, or 91-day packs. Some types of combination pills are meant to be taken continuously (365-day pills). With 21-day packs, you do not take pills for 7 days after the last pill. With 28-day packs, the pill is taken every day. The last 7 pills are without hormones. Certain types of pills have more than 21 hormone-containing pills. With 91-day packs, the first 84 pills contain both hormones, and the last 7 pills contain no hormones or contain estrogen only.  The minipill--This pill contains the progesterone hormone only. The pill is taken every day continuously. It is very important to take the pill at the same time each day. The minipill comes in packs of 28 pills. All 28 pills contain the hormone.  ADVANTAGES OF ORAL  CONTRACEPTIVE PILLS  Decreases premenstrual symptoms.   Treats menstrual period cramps.   Regulates the menstrual cycle.   Decreases a heavy menstrual flow.   May treatacne, depending on the type of pill.   Treats abnormal uterine bleeding.   Treats polycystic ovarian syndrome.   Treats endometriosis.   Can be used as emergency contraception.  THINGS THAT CAN MAKE ORAL CONTRACEPTIVE PILLS LESS EFFECTIVE OCPs can be less effective if:   You forget to take the pill at the same time every day.   You have a stomach or intestinal disease that lessens the absorption of the pill.   You take OCPs with other medicines that make OCPs less effective, such as antibiotics, certain HIV medicines, and some seizure medicines.   You take expired OCPs.   You forget to restart the pill on day 7, when using the packs of 21 pills.  RISKS ASSOCIATED WITH ORAL CONTRACEPTIVE PILLS  Oral contraceptive pills can sometimes cause side effects, such as:  Headache.  Nausea.  Breast tenderness.  Irregular bleeding or spotting. Combination pills are also associated with a small increased risk of:  Blood clots.  Heart attack.  Stroke. Document Released: 01/19/2003 Document Revised: 08/19/2013 Document Reviewed: 04/19/2013 [employer] Patient Information 2015 [employer]. This information is not intended to replace advice given to you by your health care Rosalee Tolley. Make sure you discuss any questions you have with your health care Aamina Skiff.  

## 2015-08-05 NOTE — Progress Notes (Signed)
Subjective:     Jillian Macias is a 39 y.o. female who presents for a postpartum visit. She is 6 week postpartum following a spontaneous vaginal delivery. I have fully reviewed the prenatal and intrapartum course. The delivery was at [redacted]w[redacted]d gestational weeks. Outcome: spontaneous vaginal delivery. Anesthesia: epidural. Postpartum course has been uneventful. Baby's course has been normal. Baby is feeding by bottle - Similac Advance. Bleeding no bleeding. Bowel function is abnormal: hard stools. Bladder function is normal. Patient is not sexually active. Contraception method is OCP (estrogen/progesterone). Postpartum depression screening: negative. Pt previously on ortho tri cyclen lo and requests to be prescribed again.  Pt reports clear thin discharge that she wants evaluated. Refuses pelvic or bimanual exam. Denies odor or vaginal irritation.   The following portions of the patient's history were reviewed and updated as appropriate: allergies, current medications, past family history, past medical history, past social history, past surgical history and problem list.  Review of Systems A comprehensive review of systems was negative.   Objective:    Temp(Src) 98.5 F (36.9 C)  Ht 5' 4.5" (1.638 m)  Wt 131 lb 1.6 oz (59.467 kg)  BMI 22.16 kg/m2  Breastfeeding? No  General:  alert and no distress   Breasts:  negative  Lungs: clear to auscultation bilaterally  Heart:  regular rate and rhythm, S1, S2 normal, no murmur, click, rub or gallop  Abdomen: soft, non-tender; bowel sounds normal; no masses,  no organomegaly   Vulva:  normal  Vagina: normal vagina  Cervix:  not evaluated; pt refused        Assessment:     Normal postpartum exam. Pap smear not done at today's visit.   Plan:    1. Contraception: OCP (estrogen/progesterone) 2. Normal physiologic discharge. Wet prep collected per patient's request.  3. Follow up in: 1 year or as needed.   4. Schedule appointment  ASAP with local  rheumatologist  Judeth Horn, NP

## 2015-08-05 NOTE — Progress Notes (Deleted)
Patient ID: Jillian Macias, female   DOB: December 12, 1975, 39 y.o.   MRN: [id number] Subjective:     Jillian Macias is a 39 y.o. female who presents for a postpartum visit. She is 6 weeks postpartum following a spontaneous vaginal delivery. I have fully reviewed the prenatal and intrapartum course. The delivery was at 39 gestational weeks. Outcome: spontaneous vaginal delivery. Anesthesia: epidural. Postpartum course has been ***. Baby's course has been ***. Baby is feeding by bottle - Similac with Iron. Bleeding {vag bleed:12292}. Bowel function is {normal:32111}. Bladder function is {normal:32111}. Patient is not sexually active. Contraception method is {contraceptive method:5051}. Postpartum depression screening: negative.  {Common ambulatory SmartLinks:19316}  Review of Systems {ros; complete:30496}   Objective:    Temp(Src) 98.5 F (36.9 C)  Ht 5' 4.5" (1.638 m)  Wt 131 lb 1.6 oz (59.467 kg)  BMI 22.16 kg/m2  Breastfeeding? No  General:  {gen appearance:16600}   Breasts:  {breast exam:1202::"inspection negative, no nipple discharge or bleeding, no masses or nodularity palpable"}  Lungs: {lung exam:16931}  Heart:  {heart exam:5510}  Abdomen: {abdomen exam:16834}   Vulva:  {labia exam:12198}  Vagina: {vagina exam:12200}  Cervix:  {cervix exam:14595}  Corpus: {uterus exam:12215}  Adnexa:  {adnexa exam:12223}  Rectal Exam: {rectal/vaginal exam:12274}        Assessment:    *** postpartum exam. Pap smear {done:10129} at today's visit.   Plan:    1. Contraception: {method:5051} 2. *** 3. Follow up in: {1-10:13787} {time; units:19136} or as needed.

## 2020-09-13 DIAGNOSIS — Z03818 Encounter for observation for suspected exposure to other biological agents ruled out: Secondary | ICD-10-CM | POA: Diagnosis not present

## 2020-09-13 DIAGNOSIS — Z20822 Contact with and (suspected) exposure to covid-19: Secondary | ICD-10-CM | POA: Diagnosis not present

## 2020-09-17 DIAGNOSIS — J019 Acute sinusitis, unspecified: Secondary | ICD-10-CM | POA: Diagnosis not present

## 2020-09-21 DIAGNOSIS — R040 Epistaxis: Secondary | ICD-10-CM | POA: Diagnosis not present

## 2020-09-23 DIAGNOSIS — R040 Epistaxis: Secondary | ICD-10-CM | POA: Diagnosis not present

## 2020-12-01 DIAGNOSIS — R040 Epistaxis: Secondary | ICD-10-CM | POA: Diagnosis not present

## 2020-12-01 DIAGNOSIS — J343 Hypertrophy of nasal turbinates: Secondary | ICD-10-CM | POA: Diagnosis not present

## 2020-12-06 DIAGNOSIS — B351 Tinea unguium: Secondary | ICD-10-CM | POA: Diagnosis not present

## 2020-12-06 DIAGNOSIS — L812 Freckles: Secondary | ICD-10-CM | POA: Diagnosis not present

## 2020-12-06 DIAGNOSIS — D1801 Hemangioma of skin and subcutaneous tissue: Secondary | ICD-10-CM | POA: Diagnosis not present

## 2020-12-06 DIAGNOSIS — D225 Melanocytic nevi of trunk: Secondary | ICD-10-CM | POA: Diagnosis not present

## 2021-01-26 DIAGNOSIS — Z113 Encounter for screening for infections with a predominantly sexual mode of transmission: Secondary | ICD-10-CM | POA: Diagnosis not present

## 2021-01-26 DIAGNOSIS — Z1231 Encounter for screening mammogram for malignant neoplasm of breast: Secondary | ICD-10-CM | POA: Diagnosis not present

## 2021-01-26 DIAGNOSIS — Z01411 Encounter for gynecological examination (general) (routine) with abnormal findings: Secondary | ICD-10-CM | POA: Diagnosis not present

## 2021-01-26 DIAGNOSIS — Z124 Encounter for screening for malignant neoplasm of cervix: Secondary | ICD-10-CM | POA: Diagnosis not present

## 2021-01-26 DIAGNOSIS — Z01419 Encounter for gynecological examination (general) (routine) without abnormal findings: Secondary | ICD-10-CM | POA: Diagnosis not present

## 2021-01-26 DIAGNOSIS — Z6821 Body mass index (BMI) 21.0-21.9, adult: Secondary | ICD-10-CM | POA: Diagnosis not present

## 2021-02-01 DIAGNOSIS — H43391 Other vitreous opacities, right eye: Secondary | ICD-10-CM | POA: Diagnosis not present

## 2021-02-07 DIAGNOSIS — B9689 Other specified bacterial agents as the cause of diseases classified elsewhere: Secondary | ICD-10-CM | POA: Diagnosis not present

## 2021-02-07 DIAGNOSIS — J019 Acute sinusitis, unspecified: Secondary | ICD-10-CM | POA: Diagnosis not present

## 2021-03-15 ENCOUNTER — Ambulatory Visit: Payer: Self-pay | Admitting: Physician Assistant

## 2021-04-12 DIAGNOSIS — R040 Epistaxis: Secondary | ICD-10-CM | POA: Diagnosis not present

## 2021-05-18 DIAGNOSIS — R350 Frequency of micturition: Secondary | ICD-10-CM | POA: Diagnosis not present

## 2021-05-18 DIAGNOSIS — N898 Other specified noninflammatory disorders of vagina: Secondary | ICD-10-CM | POA: Diagnosis not present

## 2021-05-18 DIAGNOSIS — B373 Candidiasis of vulva and vagina: Secondary | ICD-10-CM | POA: Diagnosis not present

## 2021-05-18 DIAGNOSIS — Z3041 Encounter for surveillance of contraceptive pills: Secondary | ICD-10-CM | POA: Diagnosis not present

## 2021-06-14 DIAGNOSIS — R195 Other fecal abnormalities: Secondary | ICD-10-CM | POA: Diagnosis not present

## 2021-06-14 DIAGNOSIS — Z20822 Contact with and (suspected) exposure to covid-19: Secondary | ICD-10-CM | POA: Diagnosis not present

## 2021-06-18 DIAGNOSIS — Z20822 Contact with and (suspected) exposure to covid-19: Secondary | ICD-10-CM | POA: Diagnosis not present

## 2021-08-02 DIAGNOSIS — J019 Acute sinusitis, unspecified: Secondary | ICD-10-CM | POA: Diagnosis not present

## 2021-08-02 DIAGNOSIS — R040 Epistaxis: Secondary | ICD-10-CM | POA: Diagnosis not present

## 2021-08-03 ENCOUNTER — Ambulatory Visit (INDEPENDENT_AMBULATORY_CARE_PROVIDER_SITE_OTHER): Payer: BC Managed Care – PPO | Admitting: Physician Assistant

## 2021-08-03 ENCOUNTER — Encounter: Payer: Self-pay | Admitting: Physician Assistant

## 2021-08-03 ENCOUNTER — Other Ambulatory Visit: Payer: Self-pay

## 2021-08-03 ENCOUNTER — Ambulatory Visit: Payer: Self-pay | Admitting: Physician Assistant

## 2021-08-03 VITALS — BP 134/88 | HR 74 | Temp 98.0°F | Ht 64.5 in | Wt 121.2 lb

## 2021-08-03 DIAGNOSIS — M3500 Sicca syndrome, unspecified: Secondary | ICD-10-CM | POA: Diagnosis not present

## 2021-08-03 DIAGNOSIS — Z87898 Personal history of other specified conditions: Secondary | ICD-10-CM

## 2021-08-03 DIAGNOSIS — Z7689 Persons encountering health services in other specified circumstances: Secondary | ICD-10-CM

## 2021-08-03 NOTE — Progress Notes (Signed)
Subjective:    Patient ID: Jillian Macias, female    DOB: 1976-09-29, 45 y.o.   MRN: [id number]  Chief Complaint  Patient presents with   Establish Care   Referral    HPI Patient is in today for establishment of care. Moved from [place] in Aug 2021. Here with husband and 70 year old daughter, who goes to [employer]. Pt works as a Veterinary surgeon.  Hx of Sjogren's Syndrome. Diagnosed at age 23. Dry eyes, mouth, & skin. Needs referral to rheumatology.  Diagnosed with sinusitis yesterday through ENT and has to pick up antibiotics today. Hx of nosebleeds. CBC last month normal per patient.   She is seeing Field seismologist. Recently had pap and breast exams, both normal.   No other concerns or issues today.   Past Medical History:  Diagnosis Date   Sjogren's syndrome [facility])     Past Surgical History:  Procedure Laterality Date   BREAST SURGERY     NO PAST SURGERIES      Family History  Problem Relation Age of Onset   Cancer Maternal Grandmother        breast    Social History   Tobacco Use   Smoking status: Never   Smokeless tobacco: Never  Vaping Use   Vaping Use: Never used  Substance Use Topics   Alcohol use: No   Drug use: No     No Known Allergies  Review of Systems REFER TO HPI FOR PERTINENT POSITIVES AND NEGATIVES      Objective:     BP 134/88   Pulse 74   Temp 98 F (36.7 C)   Ht 5' 4.5" (1.638 m)   Wt 121 lb 4 oz (55 kg)   SpO2 98%   BMI 20.49 kg/m   Wt Readings from Last 3 Encounters:  08/03/21 121 lb 4 oz (55 kg)  08/05/15 131 lb 1.6 oz (59.5 kg)  06/23/15 153 lb (69.4 kg)    BP Readings from Last 3 Encounters:  08/03/21 134/88  06/27/15 (!) 98/58  06/21/15 122/83     Physical Exam Vitals and nursing note reviewed.  Constitutional:      Appearance: Normal appearance. She is normal weight. She is not toxic-appearing.  HENT:     Head: Normocephalic and atraumatic.     Right Ear: Tympanic membrane, ear canal and external ear  normal.     Left Ear: Tympanic membrane, ear canal and external ear normal.     Nose: Septal deviation and mucosal edema present.     Right Turbinates: Enlarged and swollen.     Left Turbinates: Enlarged and swollen.     Right Sinus: Maxillary sinus tenderness present.     Left Sinus: Maxillary sinus tenderness present.     Mouth/Throat:     Mouth: Mucous membranes are moist.  Eyes:     Extraocular Movements: Extraocular movements intact.     Conjunctiva/sclera: Conjunctivae normal.     Pupils: Pupils are equal, round, and reactive to light.  Cardiovascular:     Rate and Rhythm: Normal rate and regular rhythm.     Pulses: Normal pulses.     Heart sounds: Normal heart sounds.  Pulmonary:     Effort: Pulmonary effort is normal.     Breath sounds: Normal breath sounds.  Musculoskeletal:        General: Normal range of motion.     Cervical back: Normal range of motion and neck supple.  Skin:    General: Skin  is warm and dry.  Neurological:     General: No focal deficit present.     Mental Status: She is alert and oriented to person, place, and time.  Psychiatric:        Mood and Affect: Mood normal.        Behavior: Behavior normal.        Thought Content: Thought content normal.        Judgment: Judgment normal.       Assessment & Plan:   Problem List Items Addressed This Visit       Other   Sjogren's disease (HCC) (Chronic)   Relevant Orders   Ambulatory referral to Rheumatology   Other Visit Diagnoses     Encounter to establish care    -  Primary   History of epistaxis           Pt here for establishment of care. She needs referral to rheumatology, which I have placed for her. She is being treated by ENT for hx of nosebleeds and sinusitis. She will f/up next year for fasting labs and CPE as everything is currently UTD per patient.   Murdis Flitton M Maryl Blalock, PA-C

## 2021-08-25 ENCOUNTER — Telehealth: Payer: Self-pay

## 2021-08-25 NOTE — Telephone Encounter (Signed)
Patient is calling in stating she called [facility] and they do not have her referral. Patient is requesting [place] to re-fax it.

## 2021-09-11 ENCOUNTER — Encounter: Payer: Self-pay | Admitting: Family Medicine

## 2021-09-11 ENCOUNTER — Telehealth (INDEPENDENT_AMBULATORY_CARE_PROVIDER_SITE_OTHER): Payer: BC Managed Care – PPO | Admitting: Family Medicine

## 2021-09-11 DIAGNOSIS — J111 Influenza due to unidentified influenza virus with other respiratory manifestations: Secondary | ICD-10-CM

## 2021-09-11 DIAGNOSIS — M3500 Sicca syndrome, unspecified: Secondary | ICD-10-CM | POA: Diagnosis not present

## 2021-09-11 MED ORDER — OSELTAMIVIR PHOSPHATE 75 MG PO CAPS: 75.0000 mg | ORAL_CAPSULE | Freq: Two times a day (BID) | ORAL | 0 refills | Status: DC

## 2021-09-11 MED ORDER — BENZONATATE 200 MG PO CAPS: 200.0000 mg | ORAL_CAPSULE | Freq: Two times a day (BID) | ORAL | 0 refills | Status: DC | PRN

## 2021-09-11 NOTE — Progress Notes (Signed)
   Jillian Macias is a 45 y.o. female who presents today for a virtual office visit.  Assessment/Plan:  New/Acute Problems: Influenza No red flags. High probability that she has flu given her known close sick contact in addition to her constellation of symptoms.  Will start Tamiflu given that she is still in the treatment window.  Encourage good oral hydration.  Will be avoiding antihistamines for now due to her history of Sjogren's syndrome.  We will send in Tessalon for cough.  Discussed reasons to return to care.  Chronic Problems Addressed Today: Sjogrens Syndrome Flare in setting of her current respiratory illness. Will avoid antihistamines. She should like to defer any steroids for now due to prior bad reaction.  Recommended good oral hydration.    Subjective:  HPI:  Patient here with flu like symptoms. Symptoms started about 3 days ago. Her daughter has recently been diagnosed with the flu. Symptoms include cough, fever, body aches, chills, and sore throat.  Symptoms are worsening.  She had a bad reaction to a Medrol Dosepak in the past.       Objective/Observations  Physical Exam: Gen: NAD, resting comfortably Pulm: Normal work of breathing Neuro: Grossly normal, moves all extremities Psych: Normal affect and thought content  Virtual Visit via Video   I connected with Jillian Macias on 09/11/21 at  4:00 PM EDT by a video enabled telemedicine application and verified that I am speaking with the correct person using two identifiers. The limitations of evaluation and management by telemedicine and the availability of in person appointments were discussed. The patient expressed understanding and agreed to proceed.   Patient location: Home Provider location: [place] [employer] Persons participating in the virtual visit: Myself and Patient     Katina Degree. Jimmey Ralph, MD 09/11/2021 3:57 PM

## 2021-09-16 DIAGNOSIS — B9689 Other specified bacterial agents as the cause of diseases classified elsewhere: Secondary | ICD-10-CM | POA: Diagnosis not present

## 2021-09-16 DIAGNOSIS — J019 Acute sinusitis, unspecified: Secondary | ICD-10-CM | POA: Diagnosis not present

## 2021-10-04 ENCOUNTER — Ambulatory Visit: Payer: BC Managed Care – PPO | Admitting: Physician Assistant

## 2021-10-04 ENCOUNTER — Other Ambulatory Visit: Payer: Self-pay

## 2021-10-04 ENCOUNTER — Encounter: Payer: Self-pay | Admitting: Physician Assistant

## 2021-10-04 VITALS — BP 130/80 | HR 88 | Temp 98.6°F | Ht 64.5 in | Wt 122.0 lb

## 2021-10-04 DIAGNOSIS — J101 Influenza due to other identified influenza virus with other respiratory manifestations: Secondary | ICD-10-CM | POA: Diagnosis not present

## 2021-10-04 LAB — POCT INFLUENZA A/B
Influenza A, POC: POSITIVE — AB
Influenza B, POC: NEGATIVE

## 2021-10-04 MED ORDER — OSELTAMIVIR PHOSPHATE 75 MG PO CAPS: 75.0000 mg | ORAL_CAPSULE | Freq: Two times a day (BID) | ORAL | 0 refills | Status: DC

## 2021-10-04 MED ORDER — ALBUTEROL SULFATE HFA 108 (90 BASE) MCG/ACT IN AERS: 2.0000 | INHALATION_SPRAY | Freq: Four times a day (QID) | RESPIRATORY_TRACT | 2 refills | Status: DC | PRN

## 2021-10-04 NOTE — Progress Notes (Signed)
Jillian Macias is a 45 y.o. female here for influenza.  History of Present Illness:   Chief Complaint  Patient presents with   Influenza    Fatigue, body aches, cough, chills, runny nose, congestion    HPI  Influenza Jillian Macias was previously experiencing flu like symptoms shortly after her daughter was dx with influenza type A. Sx included cough, fever, body aches, chills, and sore throat.  She did see Dr. Jimmey Ralph on 09/11/21 via virtual visit and was prescribed Tamiflu which provided relief. Shortly after starting the medication, she began experiencing nasal congestion, cough, headaches, and facial pain. Upon visiting the UC on 09/16/21, she was dx with a sinus infection. Following this visit she was prescribed omnicef which provided her with major relief.  Currently Jillian Macias returns with c/o fatigue, cough, chills, rhinorrhea, and congestion. According to pt her husband recently began experiencing sx similar to her and her daughter a couple of weeks ago when they had influenza. Reports that she and her husband tried to remain distant in an effort to not become sick again. Shortly after her daughter began experiencing similar sx again and was dx with influenza type A once again.   Following this Jillian Macias began feeling ill and was concerned that she had contracted influenza once again. In an effort to treat her sx she has been taking sudafed and OTC severe cold and flu medication as well as pushing fluids. Denies fever.   Past Medical History:  Diagnosis Date   Sjogren's syndrome (HCC)      Social History   Tobacco Use   Smoking status: Never   Smokeless tobacco: Never  Vaping Use   Vaping Use: Never used  Substance Use Topics   Alcohol use: No   Drug use: No    Past Surgical History:  Procedure Laterality Date   BREAST SURGERY     NO PAST SURGERIES      Family History  Problem Relation Age of Onset   Cancer Maternal Grandmother        breast    No Known  Allergies  Current Medications:   Current Outpatient Medications:    ascorbic acid (VITAMIN C) 1000 MG tablet, Take 1,000 mg by mouth daily., Disp: , Rfl:    hydroxychloroquine (PLAQUENIL) 200 MG tablet, Take by mouth 2 (two) times daily., Disp: , Rfl:    benzonatate (TESSALON) 200 MG capsule, Take 1 capsule (200 mg total) by mouth 2 (two) times daily as needed for cough. (Patient not taking: Reported on 10/04/2021), Disp: 20 capsule, Rfl: 0   oseltamivir (TAMIFLU) 75 MG capsule, Take 1 capsule (75 mg total) by mouth 2 (two) times daily. (Patient not taking: Reported on 10/04/2021), Disp: 10 capsule, Rfl: 0   Review of Systems:   ROS Negative unless otherwise specified per HPI. Vitals:   Vitals:   10/04/21 1610  BP: 130/80  Pulse: 88  Temp: 98.6 F (37 C)  TempSrc: Temporal  SpO2: 100%  Weight: 122 lb (55.3 kg)  Height: 5' 4.5" (1.638 m)     Body mass index is 20.62 kg/m.  Physical Exam:   Physical Exam Vitals and nursing note reviewed.  Constitutional:      General: She is not in acute distress.    Appearance: She is well-developed. She is not ill-appearing or toxic-appearing.  HENT:     Head: Normocephalic and atraumatic.     Right Ear: Tympanic membrane, ear canal and external ear normal. Tympanic membrane is not erythematous, retracted or bulging.  Left Ear: Tympanic membrane, ear canal and external ear normal. Tympanic membrane is not erythematous, retracted or bulging.     Nose: Nose normal.     Right Sinus: No maxillary sinus tenderness or frontal sinus tenderness.     Left Sinus: No maxillary sinus tenderness or frontal sinus tenderness.     Mouth/Throat:     Pharynx: Uvula midline. No posterior oropharyngeal erythema.  Eyes:     General: Lids are normal.     Conjunctiva/sclera: Conjunctivae normal.  Neck:     Trachea: Trachea normal.  Cardiovascular:     Rate and Rhythm: Normal rate and regular rhythm.     Pulses: Normal pulses.     Heart sounds:  Normal heart sounds, S1 normal and S2 normal.  Pulmonary:     Effort: Pulmonary effort is normal.     Breath sounds: Transmitted upper airway sounds present. No decreased breath sounds, wheezing, rhonchi or rales.  Lymphadenopathy:     Cervical: No cervical adenopathy.  Skin:    General: Skin is warm and dry.  Neurological:     Mental Status: She is alert.     GCS: GCS eye subscore is 4. GCS verbal subscore is 5. GCS motor subscore is 6.  Psychiatric:        Speech: Speech normal.        Behavior: Behavior normal. Behavior is cooperative.   Results for orders placed or performed in visit on 10/04/21  POCT Influenza A/B  Result Value Ref Range   Influenza A, POC Positive (A) Negative   Influenza B, POC Negative Negative     Assessment and Plan:   Influenza A Rapid test positive for flu -- no red flags at this time; she is in no acute distress Start tamiflu 75 mg BID and OTC mucinex  Albuterol inhaler prn also sent in Encouraged patient to continue pushing fluids  Worsening precautions advised in the interim  I,Havlyn C Ratchford,acting as a scribe for [employer], PA.,have documented all relevant documentation on the behalf of Jarold Motto, PA,as directed by  Jarold Motto, PA while in the presence of Jarold Motto, [place].  I, Jarold Motto, [place], have reviewed all documentation for this visit. The documentation on 10/04/21 for the exam, diagnosis, procedures, and orders are all accurate and complete.  Jarold Motto, PA-C

## 2021-10-04 NOTE — Patient Instructions (Signed)
It was great to see you!  You have a viral upper respiratory infection. Antibiotics are not needed for this.  Viral infections usually take 7-10 days to resolve.  The cough can last a few weeks to go away.  Use medication as prescribed: Tamiflu; as needed albuterol inhaler  Please start over the counter mucinex to help loosen up chest congestion.  Push fluids and get plenty of rest.  Please return if you are not improving as expected, or if you have high fevers (>101.5), shortness of breath or difficulty swallowing or worsening productive cough.  Call clinic with questions.  I hope you start feeling better soon!

## 2021-10-12 ENCOUNTER — Ambulatory Visit: Payer: BC Managed Care – PPO | Admitting: Family

## 2021-10-12 ENCOUNTER — Other Ambulatory Visit: Payer: Self-pay

## 2021-10-12 ENCOUNTER — Encounter: Payer: Self-pay | Admitting: Family

## 2021-10-12 VITALS — BP 142/100 | HR 72 | Temp 97.9°F | Ht 64.5 in | Wt 120.0 lb

## 2021-10-12 DIAGNOSIS — R030 Elevated blood-pressure reading, without diagnosis of hypertension: Secondary | ICD-10-CM

## 2021-10-12 DIAGNOSIS — R053 Chronic cough: Secondary | ICD-10-CM

## 2021-10-12 NOTE — Patient Instructions (Signed)
It was very nice to see you today!  Continue OTC sinus/cold medications including generic Mucinex per box directions, generic Claritin or Zyrtec, generic Tylenol 1,000mg  every 6 hours. OK to take Zinc up to 25mg  & up to 2,000mg  Vitamin C daily while having symptoms, then reduce doses to 3-4 days per week, or 1/2 dose daily to boost immunity.  Increase water intake to at least 2 liters daily.      PLEASE NOTE:  If you had any lab tests please let know if you have not heard back within a few days. You may see your results on MyChart before we have a chance to review them but we will give you a call once they are reviewed by [place]. If we ordered any referrals today, please let us know if you have not heard from their office within the next week.   Please try these tips to maintain a healthy lifestyle:  Eat most of your calories during the day when you are active. Eliminate processed foods including packaged sweets (pies, cakes, cookies), reduce intake of potatoes, white bread, white pasta, and white rice. Look for whole grain options, oat flour or almond flour.  Each meal should contain half fruits/vegetables, one quarter protein, and one quarter carbs (no bigger than a computer mouse).  Cut down on sweet beverages. This includes juice, soda, and sweet tea. Also watch fruit intake, though this is a healthier sweet option, it still contains natural sugar! Limit to 3 servings daily.  Drink at least 1 glass of water with each meal and aim for at least 8 glasses per day  Exercise at least 150 minutes every week.

## 2021-10-12 NOTE — Assessment & Plan Note (Signed)
No hx of this, most likely d/t frequent coughing, advised on fluids, low sodium diet, have rechecked with PCP when feeling better.

## 2021-10-12 NOTE — Assessment & Plan Note (Addendum)
Dx with Flu 1 week ago, sx started 3 days prior, 10days of sx. Pt did not get the flu shot. Advised pt it can take up to a month or longer to recover completely. Recommend continuing intake of 64oz water daily, the Mucinex, Ibuprofen for sinus pressure/aches/pains/sore throat or fever.

## 2021-10-12 NOTE — Progress Notes (Signed)
Subjective:     Patient ID: Jillian Macias, female    DOB: [birth year], 45 y.o.   MRN: [id number]  Chief Complaint  Patient presents with   Cough    Pt was recently dx with the flu. She has completed course of Tamiflu, but symptoms have not subsided.    Nasal Congestion    Chest congestion. She says that Mucinex and albuterol has not helped.     HPI Upper Respiratory Infection: Symptoms include nasal congestion and non productive cough.  Onset of symptoms was 1 week ago, unchanged since that time. She is drinking moderate amounts of fluids. Evaluation to date: seen previously and thought to have a viral URI.  Treatment to date:  Tamiflu for positive flu 1 week ago .   Health Maintenance Due  Topic Date Due   Hepatitis C Screening  Never done   PAP SMEAR-Modifier  10/29/2017   COVID-19 Vaccine (4 - Booster for Moderna series) 12/29/2020   INFLUENZA VACCINE  06/12/2021    Past Medical History:  Diagnosis Date   Sjogren's syndrome (HCC)     Past Surgical History:  Procedure Laterality Date   BREAST SURGERY     NO PAST SURGERIES      Outpatient Medications Prior to Visit  Medication Sig Dispense Refill   albuterol (VENTOLIN HFA) 108 (90 Base) MCG/ACT inhaler Inhale 2 puffs into the lungs every 6 (six) hours as needed for wheezing or shortness of breath. 8 g 2   ascorbic acid (VITAMIN C) 1000 MG tablet Take 1,000 mg by mouth daily.     hydroxychloroquine (PLAQUENIL) 200 MG tablet Take by mouth 2 (two) times daily.     benzonatate (TESSALON) 200 MG capsule Take 1 capsule (200 mg total) by mouth 2 (two) times daily as needed for cough. (Patient not taking: Reported on 10/04/2021) 20 capsule 0   oseltamivir (TAMIFLU) 75 MG capsule Take 1 capsule (75 mg total) by mouth 2 (two) times daily. (Patient not taking: Reported on 10/12/2021) 10 capsule 0   No facility-administered medications prior to visit.    No Known Allergies      Objective:    Physical Exam Vitals and  nursing note reviewed.  Constitutional:      Appearance: Normal appearance.  HENT:     Right Ear: Tympanic membrane and ear canal normal.     Left Ear: Tympanic membrane and ear canal normal.     Nose:     Right Sinus: No frontal sinus tenderness.     Left Sinus: No frontal sinus tenderness.     Mouth/Throat:     Mouth: Mucous membranes are moist.     Pharynx: No pharyngeal swelling, oropharyngeal exudate or posterior oropharyngeal erythema.  Cardiovascular:     Rate and Rhythm: Normal rate and regular rhythm.  Pulmonary:     Effort: Pulmonary effort is normal.     Breath sounds: Normal breath sounds.  Musculoskeletal:        General: Normal range of motion.  Skin:    General: Skin is warm and dry.  Neurological:     Mental Status: She is alert.  Psychiatric:        Mood and Affect: Mood normal.        Behavior: Behavior normal.    BP (!) 142/100   Pulse 72   Temp 97.9 F (36.6 C) (Temporal)   Ht 5' 4.5" (1.638 m)   Wt 120 lb (54.4 kg)   SpO2 100%   BMI  20.28 kg/m  Wt Readings from Last 3 Encounters:  10/12/21 120 lb (54.4 kg)  10/04/21 122 lb (55.3 kg)  08/03/21 121 lb 4 oz (55 kg)       Assessment & Plan:   Problem List Items Addressed This Visit       Other   Persistent cough - Primary    Dx with Flu 1 week ago, sx started 3 days prior, 10days of sx. Pt did not get the flu shot. Advised pt it can take up to a month or longer to recover completely. Recommend continuing intake of 64oz water daily, the Mucinex, Ibuprofen for sinus pressure/aches/pains/sore throat or fever.      Elevated blood pressure reading    No hx of this, most likely d/t frequent coughing, advised on fluids, low sodium diet, have rechecked with PCP when feeling better.

## 2021-10-19 DIAGNOSIS — J019 Acute sinusitis, unspecified: Secondary | ICD-10-CM | POA: Diagnosis not present

## 2021-10-19 DIAGNOSIS — B9689 Other specified bacterial agents as the cause of diseases classified elsewhere: Secondary | ICD-10-CM | POA: Diagnosis not present

## 2021-10-30 DIAGNOSIS — R040 Epistaxis: Secondary | ICD-10-CM | POA: Diagnosis not present

## 2021-11-22 DIAGNOSIS — R69 Illness, unspecified: Secondary | ICD-10-CM | POA: Diagnosis not present

## 2021-11-22 DIAGNOSIS — R040 Epistaxis: Secondary | ICD-10-CM | POA: Diagnosis not present

## 2021-12-04 ENCOUNTER — Telehealth: Payer: Self-pay

## 2021-12-07 DIAGNOSIS — R040 Epistaxis: Secondary | ICD-10-CM | POA: Diagnosis not present

## 2021-12-12 ENCOUNTER — Encounter: Payer: Self-pay | Admitting: Family

## 2021-12-12 ENCOUNTER — Other Ambulatory Visit: Payer: Self-pay

## 2021-12-12 ENCOUNTER — Telehealth: Payer: BC Managed Care – PPO | Admitting: Family

## 2021-12-12 VITALS — BP 129/82 | HR 76 | Temp 97.6°F | Ht 64.5 in | Wt 121.2 lb

## 2021-12-12 DIAGNOSIS — R040 Epistaxis: Secondary | ICD-10-CM

## 2021-12-12 DIAGNOSIS — Z13220 Encounter for screening for lipoid disorders: Secondary | ICD-10-CM

## 2021-12-12 DIAGNOSIS — M3500 Sicca syndrome, unspecified: Secondary | ICD-10-CM | POA: Diagnosis not present

## 2021-12-12 DIAGNOSIS — M542 Cervicalgia: Secondary | ICD-10-CM

## 2021-12-12 LAB — LIPID PANEL
Cholesterol: 111 mg/dL (ref 0–200)
HDL: 44.6 mg/dL (ref >39.00)
LDL Cholesterol: 51 mg/dL (ref 0–99)
NonHDL: 66.33
Total CHOL/HDL Ratio: 2
Triglycerides: 76 mg/dL (ref 0.0–149.0)
VLDL: 15.2 mg/dL (ref 0.0–40.0)

## 2021-12-12 LAB — SEDIMENTATION RATE: Sed Rate: 85 mm/hr — ABNORMAL HIGH (ref 0–20)

## 2021-12-12 NOTE — Assessment & Plan Note (Addendum)
pt still waiting on [facility] referral. having frequent nose bleeds, would like to check a sed rate today.

## 2021-12-12 NOTE — Progress Notes (Signed)
Subjective:     Patient ID: Jillian Macias, female    DOB: February 07, 1976, 46 y.o.   MRN: [id number]  Chief Complaint  Patient presents with   Neck Pain    Right side travelling toward the back of the head.   Epistaxis    More frequently, now every week. She denies headaches and dizziness.  Pt says that she has seen ENT with no resolution. She states that this is effecting her job. She is a Systems analyst.     HPI: Neck Pain: Paitent complains of neck pain. Event that precipitate these symptoms:  sleeping awkwardly . Current symptoms are described as dull, aching pain. Patient denies paresthesias in neck or back  and weakness in back, or neck , pain with turning head to side.  Patient has had no prior neck problems.  Previous treatments include:  will try to change to a cervical pillow.  Epitaxis:  pt reports having this off & on for last 57m, has been evaluated by ENT and has been using saline sprays & gel, humidifier at night to moisturize, has had cauterization performed several times, but she is still having episodes which sometimes take longer than to resolve. pt works as a Paramedic seeing patients and this interferes with her ability to do her job.   Health Maintenance Due  Topic Date Due   Hepatitis C Screening  Never done   PAP SMEAR-Modifier  10/29/2017   COVID-19 Vaccine (4 - Booster for Moderna series) 12/29/2020   INFLUENZA VACCINE  06/12/2021    Past Medical History:  Diagnosis Date   Sjogren's syndrome (HCC)     Past Surgical History:  Procedure Laterality Date   BREAST SURGERY     NO PAST SURGERIES      Outpatient Medications Prior to Visit  Medication Sig Dispense Refill   albuterol (VENTOLIN HFA) 108 (90 Base) MCG/ACT inhaler Inhale 2 puffs into the lungs every 6 (six) hours as needed for wheezing or shortness of breath. 8 g 2   ascorbic acid (VITAMIN C) 1000 MG tablet Take 1,000 mg by mouth daily.     clarithromycin (BIAXIN) 500 MG tablet Take 500 mg  by mouth 2 (two) times daily.     hydroxychloroquine (PLAQUENIL) 200 MG tablet Take by mouth 2 (two) times daily.     mupirocin ointment (BACTROBAN) 2 % Apply 1 application topically daily.     No facility-administered medications prior to visit.    No Known Allergies      Objective:    Physical Exam Vitals and nursing note reviewed.  Constitutional:      Appearance: Normal appearance.  Cardiovascular:     Rate and Rhythm: Normal rate and regular rhythm.  Pulmonary:     Effort: Pulmonary effort is normal.     Breath sounds: Normal breath sounds.  Musculoskeletal:        General: Normal range of motion.     Cervical back: Muscular tenderness (along right side to trapezius) present.  Lymphadenopathy:     Cervical: No cervical adenopathy.  Skin:    General: Skin is warm and dry.  Neurological:     Mental Status: She is alert.  Psychiatric:        Mood and Affect: Mood normal.        Behavior: Behavior normal.    BP 129/82    Pulse 76    Temp 97.6 F (36.4 C) (Temporal)    Ht 5' 4.5" (1.638 m)  Wt 121 lb 3.2 oz (55 kg)    SpO2 100%    BMI 20.48 kg/m  Wt Readings from Last 3 Encounters:  12/12/21 121 lb 3.2 oz (55 kg)  10/12/21 120 lb (54.4 kg)  10/04/21 122 lb (55.3 kg)       Assessment & Plan:   Problem List Items Addressed This Visit       Other   Sjogren's disease (HCC) (Chronic)    pt still waiting on [facility] referral. having frequent nose bleeds, would like to check a sed rate today.      Relevant Orders   Sedimentation rate (Completed)   Lipid panel (Completed)   Epistaxis - Primary    Chronic - having frequent nose bleeds, seen by ENT, doing all preventative measures with saline nasal sprays/gel, humidifier at home, has had cauterized several times. Possibly r/t her Sjogrens, still waiting on [facility] referral. pt requesting referral to [facility] today to r/o clotting disorder.      Relevant Orders   Ambulatory referral to Hematology / Oncology    Cervicalgia    musculoskeletal along right side and down to trapezius muscle. pt reports noticing after waking up, possibly misalignment while sleeping. Advised on firm pillow and/or cervical pillow, only side or back position. OK to apply analgesic creams, heat, take up to 600mg  Ibuprofen after eating tid prn.      Other Visit Diagnoses     Need for lipid screening

## 2021-12-12 NOTE — Patient Instructions (Signed)

## 2021-12-12 NOTE — Assessment & Plan Note (Addendum)
Chronic - having frequent nose bleeds, seen by ENT, doing all preventative measures with saline nasal sprays/gel, humidifier at home, has had cauterized several times. Possibly r/t her Sjogrens, still waiting on [facility] referral. pt requesting referral to [facility] today to r/o clotting disorder.

## 2021-12-14 DIAGNOSIS — M542 Cervicalgia: Secondary | ICD-10-CM | POA: Insufficient documentation

## 2021-12-14 NOTE — Assessment & Plan Note (Signed)
musculoskeletal along right side and down to trapezius muscle. pt reports noticing after waking up, possibly misalignment while sleeping. Advised on firm pillow and/or cervical pillow, only side or back position. OK to apply analgesic creams, heat, take up to 600mg  Ibuprofen after eating tid prn.

## 2021-12-15 ENCOUNTER — Telehealth: Payer: Self-pay

## 2021-12-15 NOTE — Telephone Encounter (Signed)
I called the patient and left her a VM letting her know the Rheumatology referral has been sent to the new provider she requested. I will f/u on Monday

## 2021-12-15 NOTE — Telephone Encounter (Signed)
Jillian Macias is going to take over referral and call patient back in regard.

## 2021-12-15 NOTE — Telephone Encounter (Signed)
Patient is calling back in regard to labs.  I have given patient Hudnell's response in regard.    Looks like referral needs to be followed up on.   I have informed patient I will do this and give her a call back in regard.

## 2021-12-19 ENCOUNTER — Other Ambulatory Visit: Payer: Self-pay | Admitting: *Deleted

## 2021-12-19 ENCOUNTER — Other Ambulatory Visit: Payer: Self-pay | Admitting: Nurse Practitioner

## 2021-12-19 DIAGNOSIS — R040 Epistaxis: Secondary | ICD-10-CM

## 2021-12-19 NOTE — Progress Notes (Signed)
New patient appt for Hematology for 01/16/22  and labs two days prior. Lab orders entered

## 2021-12-22 DIAGNOSIS — R040 Epistaxis: Secondary | ICD-10-CM | POA: Diagnosis not present

## 2021-12-22 DIAGNOSIS — J019 Acute sinusitis, unspecified: Secondary | ICD-10-CM | POA: Diagnosis not present

## 2021-12-25 DIAGNOSIS — J3089 Other allergic rhinitis: Secondary | ICD-10-CM | POA: Diagnosis not present

## 2021-12-29 DIAGNOSIS — R682 Dry mouth, unspecified: Secondary | ICD-10-CM | POA: Diagnosis not present

## 2021-12-29 DIAGNOSIS — M3500 Sicca syndrome, unspecified: Secondary | ICD-10-CM | POA: Diagnosis not present

## 2021-12-29 DIAGNOSIS — H04129 Dry eye syndrome of unspecified lacrimal gland: Secondary | ICD-10-CM | POA: Diagnosis not present

## 2021-12-29 DIAGNOSIS — Z79899 Other long term (current) drug therapy: Secondary | ICD-10-CM | POA: Diagnosis not present

## 2022-01-15 ENCOUNTER — Inpatient Hospital Stay: Payer: BC Managed Care – PPO

## 2022-01-16 ENCOUNTER — Inpatient Hospital Stay: Payer: BC Managed Care – PPO | Admitting: Oncology

## 2022-03-05 DIAGNOSIS — J0300 Acute streptococcal tonsillitis, unspecified: Secondary | ICD-10-CM | POA: Diagnosis not present

## 2022-04-17 DIAGNOSIS — J019 Acute sinusitis, unspecified: Secondary | ICD-10-CM | POA: Diagnosis not present

## 2022-04-27 DIAGNOSIS — Z1231 Encounter for screening mammogram for malignant neoplasm of breast: Secondary | ICD-10-CM | POA: Diagnosis not present

## 2022-04-27 DIAGNOSIS — Z6821 Body mass index (BMI) 21.0-21.9, adult: Secondary | ICD-10-CM | POA: Diagnosis not present

## 2022-04-27 DIAGNOSIS — Z01419 Encounter for gynecological examination (general) (routine) without abnormal findings: Secondary | ICD-10-CM | POA: Diagnosis not present

## 2022-05-31 DIAGNOSIS — B0052 Herpesviral keratitis: Secondary | ICD-10-CM | POA: Diagnosis not present

## 2022-06-04 DIAGNOSIS — H16143 Punctate keratitis, bilateral: Secondary | ICD-10-CM | POA: Diagnosis not present

## 2022-06-06 DIAGNOSIS — H16143 Punctate keratitis, bilateral: Secondary | ICD-10-CM | POA: Diagnosis not present

## 2022-07-31 DIAGNOSIS — M3500 Sicca syndrome, unspecified: Secondary | ICD-10-CM | POA: Diagnosis not present

## 2022-07-31 DIAGNOSIS — M25551 Pain in right hip: Secondary | ICD-10-CM | POA: Diagnosis not present

## 2022-08-06 ENCOUNTER — Encounter: Payer: Self-pay | Admitting: *Deleted

## 2022-08-09 ENCOUNTER — Ambulatory Visit: Payer: BC Managed Care – PPO | Admitting: Physician Assistant

## 2022-08-27 ENCOUNTER — Ambulatory Visit: Payer: BC Managed Care – PPO | Admitting: Physician Assistant

## 2022-08-27 ENCOUNTER — Encounter: Payer: Self-pay | Admitting: Physician Assistant

## 2022-08-27 VITALS — BP 110/70 | HR 74 | Temp 97.5°F | Ht 64.5 in | Wt 128.2 lb

## 2022-08-27 DIAGNOSIS — R21 Rash and other nonspecific skin eruption: Secondary | ICD-10-CM

## 2022-08-27 MED ORDER — KETOCONAZOLE 2 % EX CREA: TOPICAL_CREAM | CUTANEOUS | 0 refills | Status: DC

## 2022-08-27 MED ORDER — KETOCONAZOLE 2 % EX SHAM: 1.0000 | MEDICATED_SHAMPOO | CUTANEOUS | 0 refills | Status: DC

## 2022-08-27 NOTE — Patient Instructions (Signed)
It was great to see you!  Trial over the counter hydrocortisone ("Anti-itch cream") on your rash. See if this can calm it down. Use 1-2 times daily for 3 days. If it causes irritation, immediately stop.  If irritation, use topical ketoconazole -- this is an anti-fungal to treat for possible seborrheic dermatitis. Apply to area 1-2 times daily.  For your scalp, apply ketoconazole shampoo 1-2 x per week for dandruff  I will place referral to dermatology office next to [place] are good basic moisturizers to trial for your face  Take care,  Inda Coke PA-C

## 2022-08-27 NOTE — Progress Notes (Signed)
Jillian Macias is a 46 y.o. female here for a new problem.  History of Present Illness:   Chief Complaint  Patient presents with   Rash    Pt c/o rash on face x several weeks, itching. She tried vasoline, no relief.    HPI  Rash Patient complains of having a rash on face for a few weeks. The same incidence happened once before, last year but disappeared with no treatment. She reports that the rash is dry and itches. She states that she uses face masks but has since has stopped due to them possibly causing the problem.  Patient also reports that she used a make-up at the start of symptoms and thinks that this caused this but she has stopped using this at least 4 weeks ago.  She denies crusting, discharge, change in diet, and in exercise.  She also reports that she possibly has some dandruff in her scalp.  Past Medical History:  Diagnosis Date   Sjogren's syndrome ([place])      Social History   Tobacco Use   Smoking status: Never   Smokeless tobacco: Never  Vaping Use   Vaping Use: Never used  Substance Use Topics   Alcohol use: No   Drug use: No    Past Surgical History:  Procedure Laterality Date   BREAST SURGERY     NO PAST SURGERIES      Family History  Problem Relation Age of Onset   Cancer Maternal Grandmother        breast    Allergies  Allergen Reactions   Dust Mite Extract Other (See Comments)   Mixed Feathers Other (See Comments)    Current Medications:   Current Outpatient Medications:    ascorbic acid (VITAMIN C) 1000 MG tablet, Take 1,000 mg by mouth daily., Disp: , Rfl:    hydroxychloroquine (PLAQUENIL) 200 MG tablet, Take by mouth 2 (two) times daily., Disp: , Rfl:    ketoconazole (NIZORAL) 2 % cream, Apply to affected area 1-2 times daily, Disp: 30 g, Rfl: 0   ketoconazole (NIZORAL) 2 % shampoo, Apply 1 Application topically 2 (two) times a week., Disp: 120 mL, Rfl: 0   Multiple Vitamin (MULTIVITAMIN) tablet, Take 1 tablet by mouth daily., Disp: ,  Rfl:    Review of Systems:   Review of Systems  Skin:  Positive for itching and rash (dryness).    Vitals:   Vitals:   08/27/22 1601  BP: 110/70  Pulse: 74  Temp: (!) 97.5 F (36.4 C)  TempSrc: Temporal  SpO2: 99%  Weight: 128 lb 4 oz (58.2 kg)  Height: 5' 4.5" (1.638 m)     Body mass index is 21.67 kg/m.  Physical Exam:   Physical Exam Constitutional:      General: She is not in acute distress.    Appearance: Normal appearance. She is not ill-appearing.  HENT:     Head: Normocephalic and atraumatic.     Right Ear: External ear normal.     Left Ear: External ear normal.  Eyes:     Extraocular Movements: Extraocular movements intact.     Pupils: Pupils are equal, round, and reactive to light.  Cardiovascular:     Rate and Rhythm: Normal rate and regular rhythm.     Heart sounds: Normal heart sounds. No murmur heard.    No gallop.  Pulmonary:     Effort: Pulmonary effort is normal. No respiratory distress.     Breath sounds: Normal breath sounds. No wheezing  or rales.  Skin:    General: Skin is warm and dry.     Comments: Multiple erythematous papules around mouth, on chin and surrounding her nose  Neurological:     Mental Status: She is alert and oriented to person, place, and time.  Psychiatric:        Judgment: Judgment normal.     Assessment and Plan:   Rash and nonspecific skin eruption Unclear etiology Instructed patient as follows "Trial over the counter hydrocortisone ("Anti-itch cream") on your rash. See if this can calm it down. Use 1-2 times daily for 3 days. If it causes irritation, immediately stop.  If irritation, use topical ketoconazole -- this is an anti-fungal to treat for possible seborrheic dermatitis. Apply to area 1-2 times daily.  For your scalp, apply ketoconazole shampoo 1-2 x per week for dandruff  I have also placed referral to dermatology for further evaluation    I, Luna Glasgow, acting as a Education administrator for [employer],  PA.,have documented all relevant documentation on the behalf of Inda Coke, PA,as directed by  Inda Coke, PA while in the presence of Inda Coke, [place].  I, Inda Coke, [place], have reviewed all documentation for this visit. The documentation on 08/27/22 for the exam, diagnosis, procedures, and orders are all accurate and complete.   Inda Coke, PA-C

## 2022-09-19 DIAGNOSIS — N898 Other specified noninflammatory disorders of vagina: Secondary | ICD-10-CM | POA: Diagnosis not present

## 2022-10-09 DIAGNOSIS — J029 Acute pharyngitis, unspecified: Secondary | ICD-10-CM | POA: Diagnosis not present

## 2022-10-09 DIAGNOSIS — R051 Acute cough: Secondary | ICD-10-CM | POA: Diagnosis not present

## 2022-10-09 DIAGNOSIS — J019 Acute sinusitis, unspecified: Secondary | ICD-10-CM | POA: Diagnosis not present

## 2022-10-25 ENCOUNTER — Encounter: Payer: Self-pay | Admitting: *Deleted

## 2022-10-28 ENCOUNTER — Emergency Department (HOSPITAL_BASED_OUTPATIENT_CLINIC_OR_DEPARTMENT_OTHER)
Admission: EM | Admit: 2022-10-28 | Discharge: 2022-10-28 | Disposition: A | Discharge status: Home / Self Care | Payer: BC Managed Care – PPO | Attending: Emergency Medicine | Admitting: Emergency Medicine

## 2022-10-28 DIAGNOSIS — E871 Hypo-osmolality and hyponatremia: Secondary | ICD-10-CM | POA: Insufficient documentation

## 2022-10-28 DIAGNOSIS — R109 Unspecified abdominal pain: Secondary | ICD-10-CM | POA: Diagnosis not present

## 2022-10-28 DIAGNOSIS — R197 Diarrhea, unspecified: Secondary | ICD-10-CM | POA: Diagnosis not present

## 2022-10-28 DIAGNOSIS — K625 Hemorrhage of anus and rectum: Secondary | ICD-10-CM | POA: Insufficient documentation

## 2022-10-28 LAB — URINALYSIS, ROUTINE W REFLEX MICROSCOPIC
Bilirubin Urine: NEGATIVE
Glucose, UA: NEGATIVE mg/dL
Hgb urine dipstick: NEGATIVE
Ketones, ur: NEGATIVE mg/dL
Leukocytes,Ua: NEGATIVE
Nitrite: NEGATIVE
Protein, ur: NEGATIVE mg/dL
Specific Gravity, Urine: <1.005 — ABNORMAL LOW (ref 1.005–1.030)
pH: 6.5 (ref 5.0–8.0)

## 2022-10-28 LAB — CBC WITH DIFFERENTIAL/PLATELET
Abs Immature Granulocytes: 0.03 10*3/uL (ref 0.00–0.07)
Basophils Absolute: 0 10*3/uL (ref 0.0–0.1)
Basophils Relative: 0 %
Eosinophils Absolute: 0 10*3/uL (ref 0.0–0.5)
Eosinophils Relative: 0 %
HCT: 35 % — ABNORMAL LOW (ref 36.0–46.0)
Hemoglobin: 11.5 g/dL — ABNORMAL LOW (ref 12.0–15.0)
Immature Granulocytes: 0 %
Lymphocytes Relative: 5 %
Lymphs Abs: 0.5 10*3/uL — ABNORMAL LOW (ref 0.7–4.0)
MCH: 27.7 pg (ref 26.0–34.0)
MCHC: 32.9 g/dL (ref 30.0–36.0)
MCV: 84.3 fL (ref 80.0–100.0)
Monocytes Absolute: 0.4 10*3/uL (ref 0.1–1.0)
Monocytes Relative: 4 %
Neutro Abs: 8.9 10*3/uL — ABNORMAL HIGH (ref 1.7–7.7)
Neutrophils Relative %: 91 %
Platelets: 272 10*3/uL (ref 150–400)
RBC: 4.15 MIL/uL (ref 3.87–5.11)
RDW: 13.1 % (ref 11.5–15.5)
WBC: 9.8 10*3/uL (ref 4.0–10.5)
nRBC: 0 % (ref 0.0–0.2)

## 2022-10-28 LAB — COMPREHENSIVE METABOLIC PANEL
ALT: 15 U/L (ref 0–44)
AST: 22 U/L (ref 15–41)
Albumin: 3.8 g/dL (ref 3.5–5.0)
Alkaline Phosphatase: 35 U/L — ABNORMAL LOW (ref 38–126)
Anion gap: 8 (ref 5–15)
BUN: 14 mg/dL (ref 6–20)
CO2: 19 mmol/L — ABNORMAL LOW (ref 22–32)
Calcium: 8.9 mg/dL (ref 8.9–10.3)
Chloride: 106 mmol/L (ref 98–111)
Creatinine, Ser: 0.76 mg/dL (ref 0.44–1.00)
GFR, Estimated: >60 mL/min (ref >60)
Glucose, Bld: 108 mg/dL — ABNORMAL HIGH (ref 70–99)
Potassium: 3.8 mmol/L (ref 3.5–5.1)
Sodium: 133 mmol/L — ABNORMAL LOW (ref 135–145)
Total Bilirubin: 0.5 mg/dL (ref 0.3–1.2)
Total Protein: 9.4 g/dL — ABNORMAL HIGH (ref 6.5–8.1)

## 2022-10-28 LAB — LACTIC ACID, PLASMA: Lactic Acid, Venous: 0.7 mmol/L (ref 0.5–1.9)

## 2022-10-28 LAB — OCCULT BLOOD X 1 CARD TO LAB, STOOL: Fecal Occult Bld: POSITIVE — AB

## 2022-10-28 LAB — PREGNANCY, URINE: Preg Test, Ur: NEGATIVE

## 2022-10-28 LAB — HEMOGLOBIN AND HEMATOCRIT, BLOOD
HCT: 35.4 % — ABNORMAL LOW (ref 36.0–46.0)
Hemoglobin: 11.7 g/dL — ABNORMAL LOW (ref 12.0–15.0)

## 2022-10-28 LAB — LIPASE, BLOOD: Lipase: 17 U/L (ref 11–51)

## 2022-10-28 MED ORDER — LACTATED RINGERS IV BOLUS
1000.0000 mL | Freq: Once | INTRAVENOUS | Status: AC
  Administered 2022-10-28: 1000 mL via INTRAVENOUS

## 2022-10-28 MED ORDER — CIPROFLOXACIN HCL 500 MG PO TABS: 500.0000 mg | ORAL_TABLET | Freq: Two times a day (BID) | ORAL | 0 refills | Status: AC

## 2022-10-28 MED ORDER — FLUCONAZOLE 150 MG PO TABS: 150.0000 mg | ORAL_TABLET | Freq: Once | ORAL | 0 refills | Status: AC

## 2022-10-28 MED ORDER — ACETAMINOPHEN 500 MG PO TABS
1000.0000 mg | ORAL_TABLET | Freq: Once | ORAL | Status: AC
  Administered 2022-10-28: 1000 mg via ORAL
  Filled 2022-10-28: qty 2

## 2022-10-28 MED ORDER — CIPROFLOXACIN HCL 500 MG PO TABS
500.0000 mg | ORAL_TABLET | Freq: Once | ORAL | Status: AC
  Administered 2022-10-28: 500 mg via ORAL
  Filled 2022-10-28: qty 1

## 2022-10-28 MED ORDER — ONDANSETRON HCL 4 MG PO TABS: 4.0000 mg | ORAL_TABLET | Freq: Four times a day (QID) | ORAL | 0 refills | Status: DC

## 2022-10-28 MED ORDER — ONDANSETRON HCL 4 MG/2ML IJ SOLN: 4.0000 mg | Freq: Once | INTRAMUSCULAR | Status: DC

## 2022-10-28 NOTE — ED Notes (Signed)
Occult card is at bedside

## 2022-10-28 NOTE — ED Triage Notes (Signed)
Vomiting last night after eating out.  Diarrhea and bloody stool  Cramping pain to whole abd comes and goes Nausea but no vomiting.

## 2022-10-28 NOTE — ED Notes (Signed)
Pt has ginger-ale, soup, and saltines at her bed side

## 2022-10-28 NOTE — Discharge Instructions (Addendum)
Thank you for coming to [facility] Emergency Department. You were seen for bloody diarrhea. We did an exam, labs, and imaging, and these showed bloody diarrhea. This could be infectious or inflammatory diarrhea. We took stool samples and these results will return in a few days. You will be notified if they are positive.  Please take ciprofloxacin 500 mg twice per day for 7 days. Please also use zofran 4 mg every 6-8 hours as needed for nausea. Stay well hydrated at home. You were given a single dose of fluconazole if the antibiotics give you a yeast infection. Please follow up with your primary care provider within 1 week. We have also provided information for gastroenterologist you can call tomorrow to schedule follow up as well.   Do not hesitate to return to the ED or call 911 if you experience: -Worsening symptoms -Concern for worsening dehydration -Lightheadedness, passing out -Fevers/chills -Anything else that concerns you

## 2022-10-28 NOTE — ED Provider Notes (Signed)
[facility] EMERGENCY DEPT Provider Note   CSN: [id number] Arrival date & time: 10/28/22  1221     History  Chief Complaint  Patient presents with   Abdominal Pain    Jillian Macias is a 46 y.o. female with Sjogren's syndrome, who presents with abdominal pain, bloody diarrhea.   Patient reports acute onset copious diarrhea that began at 2 AM last night.  Associate with nausea with no vomiting, extreme fatigue, generalized weakness, and lightheadedness.  Endorses feeling hot and cold but has not taken her temperature at home. Diarrhea felt watery but she was in the dark all night last night and then noticed it was a pure blood when she awoke this morning.  No stool mixed in, just bright red blood.  This is never happened before.  Associated with all over generalized abdominal cramping when she has to have a bowel movement.  Patient states that they ate out last night and finished eating at approximately 930.  Her husband ate everything that she ate except for hummus.  Patient has no food allergies or intolerances.  Has never had bright red blood per rectum before.  No hemorrhoids.  No rectal pain.  Patient states that she is currently taking amoxicillin for a sinus infection, but she has had amoxicillin several times in the past and has never had diarrhea with it.  She has 4 days left of a 10-day course.  Patient has not had any travel outside [place]. No h/o IBD. No h/o rectal bleeding.   Abdominal Pain      Home Medications Prior to Admission medications   Medication Sig Start Date End Date Taking? Authorizing Provider  ascorbic acid (VITAMIN C) 1000 MG tablet Take 1,000 mg by mouth daily.    [provider]  hydroxychloroquine (PLAQUENIL) 200 MG tablet Take by mouth 2 (two) times daily.    [provider]  ketoconazole (NIZORAL) 2 % cream Apply to affected area 1-2 times daily 08/27/22   Jarold Motto, PA  ketoconazole (NIZORAL) 2 % shampoo  Apply 1 Application topically 2 (two) times a week. 08/27/22   Jarold Motto, PA  Multiple Vitamin (MULTIVITAMIN) tablet Take 1 tablet by mouth daily.    [provider]      Allergies    Dust mite extract and Mixed feathers    Review of Systems   Review of Systems  Gastrointestinal:  Positive for abdominal pain.   Review of systems Positive for chils.  A 10 point review of systems was performed and is negative unless otherwise reported in HPI.  Physical Exam Updated Vital Signs BP (!) 130/90 (BP Location: Right Arm)   Pulse 73   Temp 98.9 F (37.2 C) (Oral)   Resp 19   Ht 5' 4.5" (1.638 m)   Wt 54.4 kg   LMP 09/14/2022 (Approximate)   SpO2 99%   BMI 20.28 kg/m  Physical Exam General: Normal appearing female, lying in bed.  HEENT: PERRLA, Sclera anicteric, MMM, trachea midline.  Cardiology: RRR, no murmurs/rubs/gallops. BL radial and DP pulses equal bilaterally.  Resp: Normal respiratory rate and effort. CTAB, no wheezes, rhonchi, crackles.  Abd: Soft, non-tender, non-distended. No rebound tenderness or guarding.  GU: Deferred. MSK: No peripheral edema or signs of trauma. Extremities without deformity or TTP. No cyanosis or clubbing. Skin: warm, dry. No rashes or lesions. Back: No CVA tenderness Neuro: A&Ox4, CNs II-XII grossly intact. MAEs. Sensation grossly intact.  Psych: Normal mood and affect.   ED Results /  Procedures / Treatments   Labs (all labs ordered are listed, but only abnormal results are displayed) Labs Reviewed  COMPREHENSIVE METABOLIC PANEL - Abnormal; Notable for the following components:      Result Value   Sodium 133 (*)    CO2 19 (*)    Glucose, Bld 108 (*)    Total Protein 9.4 (*)    Alkaline Phosphatase 35 (*)    All other components within normal limits  CBC WITH DIFFERENTIAL/PLATELET - Abnormal; Notable for the following components:   Hemoglobin 11.5 (*)    HCT 35.0 (*)    Neutro Abs 8.9 (*)    Lymphs Abs 0.5 (*)    All  other components within normal limits  URINALYSIS, ROUTINE W REFLEX MICROSCOPIC - Abnormal; Notable for the following components:   Color, Urine COLORLESS (*)    Specific Gravity, Urine <1.005 (*)    All other components within normal limits  STOOL CULTURE  GASTROINTESTINAL PANEL BY PCR, STOOL (REPLACES STOOL CULTURE)  C DIFFICILE QUICK SCREEN W PCR REFLEX    LIPASE, BLOOD  PREGNANCY, URINE  OCCULT BLOOD X 1 CARD TO LAB, STOOL    EKG None  Radiology No results found.  Procedures Procedures    Medications Ordered in ED Medications  lactated ringers bolus 1,000 mL (has no administration in time range)  ondansetron (ZOFRAN) injection 4 mg (has no administration in time range)  acetaminophen (TYLENOL) tablet 1,000 mg (has no administration in time range)    ED Course/ Medical Decision Making/ A&P                          Medical Decision Making Amount and/or Complexity of Data Reviewed Labs: ordered. Decision-making details documented in ED Course.  Risk OTC drugs. Prescription drug management.    This patient presents to the ED for concern of acute bloody diarrhea, this involves an extensive number of treatment options, and is a complaint that carries with it a high risk of complications and morbidity.  I considered the following differential and admission for this acute, potentially life threatening condition.   MDM:    Patient is afebrile and HDS. For ddx of this patient's acute diarrhea, consider especially toxic or infectious diarrhea given her acute onset of symptoms and bloody diarrhea.  She has abdominal cramping and ate out at a restaurant last night raising concern for food poisoning. Consider:  -Bacterial: salmonella, ETEC, campylobacter, C diff, shigella  -Viral causes such as gastroenteritis d/t rotavirus or norovirus -No c/f traveler's diarrhea. Low c/f parasitic diarrhea such as giardia. -Noninfectious causes include pancreatitis, intraabdominal  infections such as appendicitis or diverticulitis; bowel malfunctions such as GI bleeding, SBO, or IBD. Patient does have h/o sjogren's syndrome but no known IBD history, has never had a colonoscopy. She has no focal abdominal tenderness to raise concern for appendicitis, diverticulitis, SBO/ileus. -Patient currently taking amoxicillin which could cause diarrhea but do not necessarily expect frankly bloody diarrhea as a result of antibiotic use unless c diff and patient has tolerated amoxicillin without complications several times in the past -With lightheadedness consider electrolyte abnormalities from GI losses and dehydration. Consider also renal injury or acute blood loss anemia. She is HDS and is not in hypovolemic/hemorrhagic shock, but will treat with fluids and reassess.  -Will obtain labs to further evaluate. Given the severity of her symptoms will also obtain stool culture and c diff testing.    Clinical Course as of 11/21/22 1437  Wynelle Link Oct 28, 2022  2100 Hemoglobin(!): 11.5 [HN]  2101 Hemoglobin(!): 11.7 Stable from 11.5 at 12:30 PM today [HN]  2102 Fecal Occult Blood, POC(!): POSITIVE Frankly bloody diarrhea [HN]  2102 WBC: 9.8 No leukocytosis [HN]  2102 CO2(!): 19 [HN]  2102 Sodium(!): 133 [HN]  2102 Potassium: 3.8 [HN]  2102 Comprehensive metabolic panel(!) LFTs wnl [HN]  2102 Creatinine: 0.76 [HN]  2102 BUN: 14 [HN]  2102 Lipase: 17 [HN]  2103 Preg Test, Ur: NEGATIVE [HN]  2103 Stool studies ordered and collected [HN]  2103 Temp: 98.9 F (37.2 C) Afebrile [HN]  2141 Lactic Acid, Venous: 0.7 [HN]    Clinical Course User Index [HN] Loetta Rough, MD     Labs: I Ordered, and personally interpreted labs.  The pertinent results include:  those listed above  Cardiac Monitoring: The patient was maintained on a cardiac monitor.  I personally viewed and interpreted the cardiac monitored which showed an underlying rhythm of: NSR  Reevaluation: After the interventions  noted above, I reevaluated the patient and found that they have :improved  Social Determinants of Health: Patient lives independently   Disposition:  Labs demonstrate no leukocytosis but mild hyponatremia likely hypovolemic and mild metabolic acidosis likely from bicarb losses in diarrhea. She has stable hemoglobin, no acute blood loss anemia. Cr at baseline, neg lipase. Patient feels improved after the fluids and she is taking PO well. No longer feels lightheaded. Still afebrile/HDS. She has had a decrease in the frequency of stools while here in the emergency department. Discussed with patient at length her differential diagnosis and pros/cons of treatment with antibiotics. After shared decision making with patient will treat with antibiotics ciprofloxacin for presumed infectious diarrhea. Discussed also that given her autoimmune disease history will likely benefit from f/u with GI specialist in the future for consideration of inflammatory diarrhea.  Patient understands that her stool studies will not result today but that she will be notified if something results positive and she needs to fu with her PCP within the next week for reevaluation. Will also prescribe zofran for her nausea to ensure she can stay well hydrated at home. Patient requests fluconazole as she states she often gets yeast infections with antibiotics. Instructed to also continue and finish her prescription for her amoxicillin. DC w/ discharge instructions/return precautions. All questions answered to patient's satisfaction.   Co morbidities that complicate the patient evaluation  Past Medical History:  Diagnosis Date   Sjogren's syndrome ([facility])      Medicines Meds ordered this encounter  Medications   lactated ringers bolus 1,000 mL   ondansetron (ZOFRAN) injection 4 mg   acetaminophen (TYLENOL) tablet 1,000 mg    I have reviewed the patients home medicines and have made adjustments as needed  Problem List / ED  Course: Problem List Items Addressed This Visit   None Visit Diagnoses     Acute diarrhea    -  Primary   Bright red blood per rectum                    This note was created using dictation software, which may contain spelling or grammatical errors.    Loetta Rough, MD 11/21/22 [phone]

## 2022-10-29 LAB — GASTROINTESTINAL PANEL BY PCR, STOOL (REPLACES STOOL CULTURE)

## 2022-10-29 LAB — C DIFFICILE QUICK SCREEN W PCR REFLEX
C Diff antigen: NEGATIVE
C Diff interpretation: NOT DETECTED
C Diff toxin: NEGATIVE

## 2022-11-02 ENCOUNTER — Encounter: Payer: Self-pay | Admitting: Physician Assistant

## 2022-11-02 ENCOUNTER — Ambulatory Visit: Payer: BC Managed Care – PPO | Admitting: Physician Assistant

## 2022-11-02 VITALS — BP 126/84 | HR 64 | Temp 97.7°F | Ht 64.5 in | Wt 127.6 lb

## 2022-11-02 DIAGNOSIS — Z1211 Encounter for screening for malignant neoplasm of colon: Secondary | ICD-10-CM | POA: Diagnosis not present

## 2022-11-02 DIAGNOSIS — R197 Diarrhea, unspecified: Secondary | ICD-10-CM

## 2022-11-02 DIAGNOSIS — K625 Hemorrhage of anus and rectum: Secondary | ICD-10-CM | POA: Diagnosis not present

## 2022-11-02 DIAGNOSIS — D649 Anemia, unspecified: Secondary | ICD-10-CM | POA: Diagnosis not present

## 2022-11-02 NOTE — Progress Notes (Unsigned)
Subjective:    Patient ID: Jillian Macias, female    DOB: [birth year], 46 y.o.   MRN: [id number]  Chief Complaint  Patient presents with   Follow-up    Pt in office for ED follow up; pt is feeling better today, pt finally had a bowel movement yesterday and feeling better; pt said she still had bright red blood in the stool; not as much as when going to ED but still present;     HPI Patient is in today for ED f/up. She was seen on 10/28/22 for acute diarrhea, abdominal pain, and rectal bleeding. Started about 3 hours after eating out at restaurant on 10/27/22. Stool cultures were negative at ED.   Yesterday she was able to use the bathroom and having less bloating now. Still having BRBPR streaks in stool yesterday. Overall feeling like she is much better.   No history of GI issues. No family history of colon issues.   Past Medical History:  Diagnosis Date   Sjogren's syndrome [facility])     Past Surgical History:  Procedure Laterality Date   BREAST SURGERY     NO PAST SURGERIES      Family History  Problem Relation Age of Onset   Cancer Maternal Grandmother        breast    Social History   Tobacco Use   Smoking status: Never   Smokeless tobacco: Never  Vaping Use   Vaping Use: Never used  Substance Use Topics   Alcohol use: No   Drug use: No     Allergies  Allergen Reactions   Dust Mite Extract Other (See Comments)   Mixed Feathers Other (See Comments)    Review of Systems NEGATIVE UNLESS OTHERWISE INDICATED IN HPI      Objective:     BP 126/84 (BP Location: Left Arm)   Pulse 64   Temp 97.7 F (36.5 C) (Temporal)   Ht 5' 4.5" (1.638 m)   Wt 127 lb 9.6 oz (57.9 kg)   LMP 10/14/2022 (Approximate)   SpO2 100%   BMI 21.56 kg/m   Wt Readings from Last 3 Encounters:  11/02/22 127 lb 9.6 oz (57.9 kg)  10/28/22 120 lb (54.4 kg)  08/27/22 128 lb 4 oz (58.2 kg)    BP Readings from Last 3 Encounters:  11/02/22 126/84  10/28/22 126/84  08/27/22 110/70      Physical Exam Vitals and nursing note reviewed.  Constitutional:      Appearance: Normal appearance. She is normal weight. She is not toxic-appearing.  HENT:     Head: Normocephalic and atraumatic.  Eyes:     Extraocular Movements: Extraocular movements intact.     Conjunctiva/sclera: Conjunctivae normal.     Pupils: Pupils are equal, round, and reactive to light.  Cardiovascular:     Rate and Rhythm: Normal rate and regular rhythm.     Pulses: Normal pulses.     Heart sounds: Normal heart sounds.  Pulmonary:     Effort: Pulmonary effort is normal.     Breath sounds: Normal breath sounds.  Abdominal:     General: Abdomen is flat. Bowel sounds are normal. There is no distension.     Palpations: Abdomen is soft. There is no mass.     Tenderness: There is no abdominal tenderness. There is no guarding.  Musculoskeletal:        General: Normal range of motion.     Cervical back: Normal range of motion and neck supple.  Skin:  General: Skin is warm and dry.  Neurological:     General: No focal deficit present.     Mental Status: She is alert and oriented to person, place, and time.  Psychiatric:        Mood and Affect: Mood normal.        Behavior: Behavior normal.        Thought Content: Thought content normal.        Judgment: Judgment normal.        Assessment & Plan:  Acute diarrhea  Bright red blood per rectum  Screening for colon cancer -     Ambulatory referral to Gastroenterology  Low hemoglobin -     CBC with Differential/Platelet; Future  I personally reviewed her ED notes from 10/28/2022.  I also reviewed her labs done at that time, noting a slightly lower than normal hemoglobin for her at 11.5.  I do suggest rechecking CBC in a few weeks to make sure this comes back to baseline normal.  Her fecal occult blood was positive.  Her GI panel and C. difficile were negative.  Symptoms do sound consistent with acute colitis and thankfully she is feeling better  at this time.  She did not have any imaging done.  Recommend continuing a BRAT diet, pushing fluids, and calling if any symptoms return or blood does not completely clear from the stool.  No  I also discussed with patient that colon cancer screening starts at age 21.  I do recommend consulting with GI about colon cancer screening.  Placed referral for this today.    Return in about 4 weeks (around 11/30/2022) for LAB ONLY - CBC .  This note was prepared with assistance of Systems analyst. Occasional wrong-word or sound-a-like substitutions may have occurred due to the inherent limitations of voice recognition software.     Colt Martelle M Lazlo Tunney, PA-C

## 2022-12-24 DIAGNOSIS — J310 Chronic rhinitis: Secondary | ICD-10-CM | POA: Diagnosis not present

## 2022-12-24 DIAGNOSIS — R040 Epistaxis: Secondary | ICD-10-CM | POA: Diagnosis not present

## 2022-12-24 DIAGNOSIS — J343 Hypertrophy of nasal turbinates: Secondary | ICD-10-CM | POA: Diagnosis not present

## 2022-12-31 ENCOUNTER — Other Ambulatory Visit: Payer: Self-pay | Admitting: Otolaryngology

## 2022-12-31 DIAGNOSIS — J329 Chronic sinusitis, unspecified: Secondary | ICD-10-CM

## 2023-01-24 DIAGNOSIS — M3500 Sicca syndrome, unspecified: Secondary | ICD-10-CM | POA: Diagnosis not present

## 2023-02-04 ENCOUNTER — Encounter: Payer: Self-pay | Admitting: Otolaryngology

## 2023-02-12 ENCOUNTER — Ambulatory Visit
Admission: RE | Admit: 2023-02-12 | Discharge: 2023-02-12 | Disposition: A | Discharge status: Home / Self Care | Payer: BC Managed Care – PPO | Source: Ambulatory Visit | Attending: Otolaryngology | Admitting: Otolaryngology

## 2023-02-12 DIAGNOSIS — J329 Chronic sinusitis, unspecified: Secondary | ICD-10-CM

## 2023-02-12 DIAGNOSIS — K011 Impacted teeth: Secondary | ICD-10-CM | POA: Diagnosis not present

## 2023-02-12 DIAGNOSIS — Z9889 Other specified postprocedural states: Secondary | ICD-10-CM | POA: Diagnosis not present

## 2023-02-12 DIAGNOSIS — J342 Deviated nasal septum: Secondary | ICD-10-CM | POA: Diagnosis not present

## 2023-02-12 DIAGNOSIS — J3489 Other specified disorders of nose and nasal sinuses: Secondary | ICD-10-CM | POA: Diagnosis not present

## 2023-02-20 ENCOUNTER — Telehealth: Payer: Self-pay | Admitting: Internal Medicine

## 2023-02-20 NOTE — Telephone Encounter (Signed)
Patient called to verify time and date of appointment.

## 2023-02-21 ENCOUNTER — Inpatient Hospital Stay: Payer: BC Managed Care – PPO | Attending: Internal Medicine | Admitting: Internal Medicine

## 2023-02-21 ENCOUNTER — Other Ambulatory Visit: Payer: Self-pay | Admitting: Internal Medicine

## 2023-02-21 ENCOUNTER — Encounter: Payer: Self-pay | Admitting: Internal Medicine

## 2023-02-21 ENCOUNTER — Inpatient Hospital Stay: Payer: BC Managed Care – PPO

## 2023-02-21 VITALS — BP 155/94 | HR 74 | Temp 97.6°F | Resp 16 | Ht 64.0 in | Wt 122.9 lb

## 2023-02-21 DIAGNOSIS — J343 Hypertrophy of nasal turbinates: Secondary | ICD-10-CM | POA: Diagnosis not present

## 2023-02-21 DIAGNOSIS — D472 Monoclonal gammopathy: Secondary | ICD-10-CM | POA: Insufficient documentation

## 2023-02-21 DIAGNOSIS — M3500 Sicca syndrome, unspecified: Secondary | ICD-10-CM | POA: Insufficient documentation

## 2023-02-21 DIAGNOSIS — D649 Anemia, unspecified: Secondary | ICD-10-CM | POA: Insufficient documentation

## 2023-02-21 DIAGNOSIS — D539 Nutritional anemia, unspecified: Secondary | ICD-10-CM

## 2023-02-21 DIAGNOSIS — Z803 Family history of malignant neoplasm of breast: Secondary | ICD-10-CM | POA: Diagnosis not present

## 2023-02-21 DIAGNOSIS — J310 Chronic rhinitis: Secondary | ICD-10-CM | POA: Diagnosis not present

## 2023-02-21 DIAGNOSIS — G629 Polyneuropathy, unspecified: Secondary | ICD-10-CM | POA: Insufficient documentation

## 2023-02-21 DIAGNOSIS — J342 Deviated nasal septum: Secondary | ICD-10-CM | POA: Diagnosis not present

## 2023-02-21 LAB — CMP (CANCER CENTER ONLY)
ALT: 14 U/L (ref 0–44)
AST: 19 U/L (ref 15–41)
Albumin: 3.9 g/dL (ref 3.5–5.0)
Alkaline Phosphatase: 38 U/L (ref 38–126)
Anion gap: 4 — ABNORMAL LOW (ref 5–15)
BUN: 19 mg/dL (ref 6–20)
CO2: 22 mmol/L (ref 22–32)
Calcium: 9 mg/dL (ref 8.9–10.3)
Chloride: 107 mmol/L (ref 98–111)
Creatinine: 0.8 mg/dL (ref 0.44–1.00)
GFR, Estimated: >60 mL/min (ref >60)
Glucose, Bld: 84 mg/dL (ref 70–99)
Potassium: 3.6 mmol/L (ref 3.5–5.1)
Sodium: 133 mmol/L — ABNORMAL LOW (ref 135–145)
Total Bilirubin: 0.5 mg/dL (ref 0.3–1.2)
Total Protein: 9.6 g/dL — ABNORMAL HIGH (ref 6.5–8.1)

## 2023-02-21 LAB — CBC WITH DIFFERENTIAL (CANCER CENTER ONLY)
Abs Immature Granulocytes: 0.01 10*3/uL (ref 0.00–0.07)
Basophils Absolute: 0.1 10*3/uL (ref 0.0–0.1)
Basophils Relative: 2 %
Eosinophils Absolute: 0 10*3/uL (ref 0.0–0.5)
Eosinophils Relative: 1 %
HCT: 36 % (ref 36.0–46.0)
Hemoglobin: 11.7 g/dL — ABNORMAL LOW (ref 12.0–15.0)
Immature Granulocytes: 0 %
Lymphocytes Relative: 27 %
Lymphs Abs: 1.1 10*3/uL (ref 0.7–4.0)
MCH: 27.7 pg (ref 26.0–34.0)
MCHC: 32.5 g/dL (ref 30.0–36.0)
MCV: 85.3 fL (ref 80.0–100.0)
Monocytes Absolute: 0.4 10*3/uL (ref 0.1–1.0)
Monocytes Relative: 10 %
Neutro Abs: 2.5 10*3/uL (ref 1.7–7.7)
Neutrophils Relative %: 60 %
Platelet Count: 281 10*3/uL (ref 150–400)
RBC: 4.22 MIL/uL (ref 3.87–5.11)
RDW: 12.7 % (ref 11.5–15.5)
WBC Count: 4.1 10*3/uL (ref 4.0–10.5)
nRBC: 0 % (ref 0.0–0.2)

## 2023-02-21 LAB — VITAMIN B12: Vitamin B-12: 437 pg/mL (ref 180–914)

## 2023-02-21 LAB — FOLATE: Folate: 12.9 ng/mL (ref >5.9)

## 2023-02-21 LAB — IRON AND IRON BINDING CAPACITY (CC-WL,HP ONLY)
Iron: 79 ug/dL (ref 28–170)
Saturation Ratios: 25 % (ref 10.4–31.8)
TIBC: 319 ug/dL (ref 250–450)
UIBC: 240 ug/dL (ref 148–442)

## 2023-02-21 LAB — LACTATE DEHYDROGENASE: LDH: 132 U/L (ref 98–192)

## 2023-02-21 LAB — FERRITIN: Ferritin: 39 ng/mL (ref 11–307)

## 2023-02-21 NOTE — Progress Notes (Signed)
[facility] [phone]   [phone]  CONSULT NOTE  REFERRING PHYSICIAN: Dr. 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  HPI Jillian Macias is a 47 y.o. female with past sickle history significant for Sjogren and currently on treatment with Plaquenil 150 mg p.o. daily.  She was diagnosed with Sjogren 25 years ago and has been managed very well.  She always have symptoms from her disease including dry mouth and the skin as well as palpable lymphadenopathy in the submandibular area.  Over the last 1-2 years she has been complaining of chronic sinus infection and she is seen by Dr. Danice Goltz with ENT.  She has been on treatment with antibiotics on and off for the last 2 years.  2 weeks ago she has infection of a tooth in the left lower jaw with a chipped front tooth.  She is followed closely by ophthalmology for her retina test because of the treatment of the Plaquenil.  She has been seen by rheumatology and on recent blood work she had SPEP that showed positive monoclonal gammopathy.  She was referred to me today for evaluation and recommendation regarding her condition.  She also noticed some tingling in the left little finger as well as the right foot.  She has no other complaints.  She has no chest pain, shortness of breath, cough or hemoptysis.  She has no recent weight loss or night sweats.  She has no nausea, vomiting, diarrhea or constipation.  She is very active and exercises at regular basis. Family history significant for mother and father who are healthy.  Maternal grandmother and maternal aunt had breast cancer. The patient is married and has 1 daughter age 65.  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  She has no history for smoking, alcohol or drug abuse.  HPI  Past Medical History:  Diagnosis Date   Sjogren's syndrome (HCC)     Past Surgical History:  Procedure Laterality Date   BREAST  SURGERY     NO PAST SURGERIES      Family History  Problem Relation Age of Onset   Cancer Maternal Grandmother        breast    Social History Social History   Tobacco Use   Smoking status: Never   Smokeless tobacco: Never  Vaping Use   Vaping Use: Never used  Substance Use Topics   Alcohol use: No   Drug use: No    Allergies  Allergen Reactions   Dust Mite Extract Other (See Comments)   Mixed Feathers Other (See Comments)    Current Outpatient Medications  Medication Sig Dispense Refill   amoxicillin-clavulanate (AUGMENTIN) 875-125 MG tablet Take 1 tablet by mouth 2 (two) times daily.     ascorbic acid (VITAMIN C) 1000 MG tablet Take 1,000 mg by mouth daily.     fluconazole (DIFLUCAN) 150 MG tablet Take 150 mg by mouth once.     hydroxychloroquine (PLAQUENIL) 200 MG tablet Take by mouth 2 (two) times daily.     ketoconazole (NIZORAL) 2 % cream Apply to affected area 1-2 times daily (Patient not taking: Reported on 11/02/2022) 30 g 0   ketoconazole (NIZORAL) 2 % shampoo Apply 1 Application topically 2 (two) times a week. (Patient not taking: Reported on 11/02/2022) 120 mL 0   Multiple Vitamin (MULTIVITAMIN) tablet Take 1 tablet by mouth daily.     ondansetron (ZOFRAN) 4 MG tablet Take 1 tablet (4 mg  total) by mouth every 6 (six) hours. (Patient not taking: Reported on 11/02/2022) 12 tablet 0   No current facility-administered medications for this visit.    Review of Systems  Constitutional: negative Eyes: negative Ears, nose, mouth, throat, and face: negative Respiratory: negative Cardiovascular: negative Gastrointestinal: negative Genitourinary:negative Integument/breast: positive for dryness Hematologic/lymphatic: negative Musculoskeletal:negative Neurological: positive for paresthesia Behavioral/Psych: negative Endocrine: negative Allergic/Immunologic: negative  Physical Exam  UPJ:SRPRX, healthy, no distress, well nourished, and well developed SKIN:  skin color, texture, turgor are normal, no rashes or significant lesions HEAD: Normocephalic, No masses, lesions, tenderness or abnormalities EYES: normal, PERRLA, Conjunctiva are pink and non-injected EARS: External ears normal, Canals clear OROPHARYNX:no exudate, no erythema, and lips, buccal mucosa, and tongue normal  NECK: supple, no adenopathy, no JVD LYMPH:  no palpable lymphadenopathy, no hepatosplenomegaly BREAST:not examined LUNGS: clear to auscultation , and palpation HEART: regular rate & rhythm, no murmurs, and no gallops ABDOMEN:abdomen soft, non-tender, normal bowel sounds, and no masses or organomegaly BACK: Back symmetric, no curvature., No CVA tenderness EXTREMITIES:no joint deformities, effusion, or inflammation, no edema  NEURO: alert & oriented x 3 with fluent speech, no focal motor/sensory deficits  PERFORMANCE STATUS: ECOG 1  LABORATORY DATA: Lab Results  Component Value Date   WBC 9.8 10/28/2022   HGB 11.7 (L) 10/28/2022   HCT 35.4 (L) 10/28/2022   MCV 84.3 10/28/2022   PLT 272 10/28/2022      Chemistry      Component Value Date/Time   NA 133 (L) 10/28/2022 1257   K 3.8 10/28/2022 1257   CL 106 10/28/2022 1257   CO2 19 (L) 10/28/2022 1257   BUN 14 10/28/2022 1257   CREATININE 0.76 10/28/2022 1257   CREATININE 0.96 05/23/2015 1505      Component Value Date/Time   CALCIUM 8.9 10/28/2022 1257   ALKPHOS 35 (L) 10/28/2022 1257   AST 22 10/28/2022 1257   ALT 15 10/28/2022 1257   BILITOT 0.5 10/28/2022 1257       RADIOGRAPHIC STUDIES: CT MAXILLOFACIAL WO CONTRAST  Result Date: 02/12/2023 CLINICAL DATA:  Provided history: Chronic sinusitis, unspecified location. Additional history provided: History of rhinoplasty. EXAM: CT MAXILLOFACIAL WITHOUT CONTRAST TECHNIQUE: Multidetector CT images of the paranasal sinuses were obtained using the standard protocol without intravenous contrast. RADIATION DOSE REDUCTION: This exam was performed according to the  departmental dose-optimization program which includes automated exposure control, adjustment of the mA and/or kV according to patient size and/or use of iterative reconstruction technique. COMPARISON:  No pertinent prior exams available for comparison. FINDINGS: Paranasal sinuses: Frontal: Hypoplastic frontal sinuses (left greater than right). Otherwise normally aerated. Patent frontal sinus drainage pathways. Ethmoid: Normally aerated. Maxillary: Minimal mucosal thickening within the bilateral maxillary sinuses. Sphenoid: Small-volume frothy secretions within the right sphenoid sinus. The left sphenoid sinus is normally aerated. Patent sphenoethmoidal recesses. Right ostiomeatal unit: The ethmoid infundibulum is narrowed by mucosal thickening. Otherwise patent. Left ostiomeatal unit: The maxillary sinus ostium is narrowed by mucosal thickening. Otherwise patent. Nasal passages: Leftward deviation of the nasal septum. Mild mucosal thickening within the bilateral nasal passages. Minimal secretions within the left nasal passage. Anatomy: No pneumatization superior to anterior ethmoid notches. Symmetric and intact olfactory grooves and fovea ethmoidalis, Keros I (1-88mm). Sellar sphenoid pneumatization pattern. Other: Impacted left maxillary third molar tooth (series 6, image 55). Other: Heterogeneous appearance of the bilateral parotid glands consistent with the patient's history of Sjogren syndrome. IMPRESSION: 1. Hypoplastic frontal sinuses. 2. Small-volume frothy secretions within the right sphenoid sinus. 3.  Minimal mucosal thickening within the bilateral maxillary sinuses. 4. The right ethmoid infundibulum is narrowed by mucosal thickening. 5. The left maxillary sinus ostium is narrowed by mucosal thickening. 6. Leftward deviation of the nasal septum. 7. Mild mucosal thickening within the bilateral nasal passages. 8. Minimal secretions within the left nasal passage. 9. Impacted left maxillary third molar tooth.  10. Heterogeneous appearance of the bilateral parotid glands consistent with the patient's history of Sjogren syndrome. Electronically Signed   By: Jackey LogeKyle  Golden D.O.   On: 02/12/2023 18:14    ASSESSMENT: This is a very pleasant 47 years old Hispanic female with history of Sjogren syndrome and was found recently to have monoclonal gammopathy on routine blood work by her rheumatologist.  The patient is currently asymptomatic and feeling well.   PLAN: I had a lengthy discussion with the patient today about her current condition and further investigation to rule out any underlying malignancy. I ordered several studies today including myeloma panel in addition to iron study, ferritin, vitamin B12 as well as serum folate because of her anemia. She is very anxious and concerned about possibility of underlying neoplasm like multiple myeloma or lymphoma. If there is any concerning findings on these blood work, I will call the patient with results and recommendation. Otherwise I will see her back for follow-up visit in 3 months for evaluation with repeat myeloma panel. For the neuropathy, she has an appointment with neurology in around 2 months. The patient will continue her current treatment with Plaquenil and follow-up with her rheumatologist for the Sjogren syndrome. She was advised to call immediately if she has any other concerning symptoms in the interval.  The patient voices understanding of current disease status and treatment options and is in agreement with the current care plan.  All questions were answered. The patient knows to call the clinic with any problems, questions or concerns. We can certainly see the patient much sooner if necessary.  Thank you so much for allowing me to participate in the care of Jillian Macias. I will continue to follow up the patient with you and assist in her care.  The total time spent in the appointment was 60 minutes.  Disclaimer: This note was dictated with  voice recognition software. Similar sounding words can inadvertently be transcribed and may not be corrected upon review.   Lajuana MatteMohamed K Hulen Mandler February 21, 2023, 11:56 AM

## 2023-02-22 LAB — KAPPA/LAMBDA LIGHT CHAINS
Kappa free light chain: 50.8 mg/L — ABNORMAL HIGH (ref 3.3–19.4)
Kappa, lambda light chain ratio: 0.53 (ref 0.26–1.65)
Lambda free light chains: 95.5 mg/L — ABNORMAL HIGH (ref 5.7–26.3)

## 2023-02-22 LAB — BETA 2 MICROGLOBULIN, SERUM: Beta-2 Microglobulin: 2.2 mg/L (ref 0.6–2.4)

## 2023-02-23 ENCOUNTER — Other Ambulatory Visit: Payer: Self-pay | Admitting: Internal Medicine

## 2023-02-23 DIAGNOSIS — D472 Monoclonal gammopathy: Secondary | ICD-10-CM

## 2023-02-23 LAB — IGG, IGA, IGM
IgA: 268 mg/dL (ref 87–352)
IgG (Immunoglobin G), Serum: 4132 mg/dL — ABNORMAL HIGH (ref 586–1602)
IgM (Immunoglobulin M), Srm: 77 mg/dL (ref 26–217)

## 2023-02-23 NOTE — Progress Notes (Unsigned)
I called the patient and informed her about the results of her blood work including the elevated IgG as well as the elevated light chains. I will order a bone marrow biopsy and aspirate as well as skeletal bone survey and 24-hour urine protein electrophoresis with immunofixation. I will move her appointment to be done sooner in about 1 months. She is in agreement with the current recommendation. She was advised to call if she has any other concerning issues.

## 2023-02-25 ENCOUNTER — Telehealth: Payer: Self-pay | Admitting: Internal Medicine

## 2023-02-25 NOTE — Telephone Encounter (Signed)
Contacted patient to scheduled appointments. Left message with appointment details and a call back number if patient had any questions or could not accommodate the time we provided.   

## 2023-02-27 ENCOUNTER — Ambulatory Visit (HOSPITAL_COMMUNITY)
Admission: RE | Admit: 2023-02-27 | Discharge: 2023-02-27 | Disposition: A | Discharge status: Home / Self Care | Payer: BC Managed Care – PPO | Source: Ambulatory Visit | Attending: Internal Medicine | Admitting: Internal Medicine

## 2023-02-27 DIAGNOSIS — D472 Monoclonal gammopathy: Secondary | ICD-10-CM | POA: Diagnosis not present

## 2023-02-28 ENCOUNTER — Other Ambulatory Visit: Payer: BC Managed Care – PPO

## 2023-03-06 ENCOUNTER — Other Ambulatory Visit: Payer: Self-pay | Admitting: Radiology

## 2023-03-08 ENCOUNTER — Ambulatory Visit (HOSPITAL_COMMUNITY): Payer: BC Managed Care – PPO

## 2023-03-12 ENCOUNTER — Encounter: Payer: Self-pay | Admitting: Internal Medicine

## 2023-03-25 ENCOUNTER — Ambulatory Visit (HOSPITAL_COMMUNITY): Payer: BC Managed Care – PPO

## 2023-03-26 ENCOUNTER — Telehealth: Payer: Self-pay | Admitting: Physician Assistant

## 2023-03-28 ENCOUNTER — Telehealth: Payer: Self-pay | Admitting: Internal Medicine

## 2023-03-28 ENCOUNTER — Inpatient Hospital Stay: Payer: BC Managed Care – PPO | Admitting: Internal Medicine

## 2023-03-28 ENCOUNTER — Other Ambulatory Visit: Payer: Self-pay | Admitting: Radiology

## 2023-03-28 ENCOUNTER — Other Ambulatory Visit: Payer: Self-pay

## 2023-03-28 ENCOUNTER — Telehealth: Payer: Self-pay | Admitting: Physician Assistant

## 2023-03-28 ENCOUNTER — Other Ambulatory Visit: Payer: BC Managed Care – PPO

## 2023-03-28 DIAGNOSIS — D472 Monoclonal gammopathy: Secondary | ICD-10-CM

## 2023-03-28 DIAGNOSIS — Z1211 Encounter for screening for malignant neoplasm of colon: Secondary | ICD-10-CM

## 2023-03-28 NOTE — Telephone Encounter (Signed)
MyChart message sent to patient advising PCP response.

## 2023-03-28 NOTE — Telephone Encounter (Signed)
Pt states in the last appt, it was talked about getting the Cologuard sent to her. Please put an order in for pt.

## 2023-03-28 NOTE — Consult Note (Signed)
Chief Complaint: Patient was seen in consultation today for CT-guided bone marrow biopsy  Referring Physician(s): Mohamed,Mohamed  Supervising Physician: Oley Balm  Patient Status: [facility] - Out-pt  History of Present Illness: Jillian Macias is a 47 y.o. female past medical history of Sjogren's syndrome  who presents now with positive monoclonal gammopathy noted on recent blood work by her rheumatologist.  Other testing by oncology has revealed elevated IgG as well as elevated light chains. She  is scheduled today for CT-guided bone marrow biopsy for further evaluation.  Past Medical History:  Diagnosis Date   Sjogren's syndrome [facility])     Past Surgical History:  Procedure Laterality Date   BREAST SURGERY     NO PAST SURGERIES      Allergies: Dust mite extract and Mixed feathers  Medications: Prior to Admission medications   Medication Sig Start Date End Date Taking? Authorizing Provider  ascorbic acid (VITAMIN C) 1000 MG tablet Take 1,000 mg by mouth daily.    [provider]  hydroxychloroquine (PLAQUENIL) 200 MG tablet Take by mouth 2 (two) times daily.    [provider]     Family History  Problem Relation Age of Onset   Cancer Maternal Grandmother        breast    Social History   Socioeconomic History   Marital status: Married    Spouse name: Not on file   Number of children: Not on file   Years of education: Not on file   Highest education level: Not on file  Occupational History   Occupation: psychotherapist  Tobacco Use   Smoking status: Never   Smokeless tobacco: Never  Vaping Use   Vaping Use: Never used  Substance and Sexual Activity   Alcohol use: No   Drug use: No   Sexual activity: Never    Birth control/protection: None  Other Topics Concern   Not on file  Social History Narrative   ** Merged History Encounter **       Moved to [place] from [place], [place] 03/2013   Social Determinants of Health    Financial Resource Strain: Not on file  Food Insecurity: Not on file  Transportation Needs: Not on file  Physical Activity: Not on file  Stress: Not on file  Social Connections: Not on file      Review of Systems : denies fever, HA, chest pain, dyspnea, cough, abdominal/back pain, nausea, or bleeding.  She is anxious.  Vital Signs: Vitals:   03/29/23 0846  BP: (!) 130/98  Pulse: 70  Resp: 16  Temp: 98.1 F (36.7 C)  SpO2: 100%    Advance care plan: Patient does not have an advance care plan at this time.   Code Status: FULL CODE    Physical Exam: Awake, alert.  Chest clear to auscultation bilaterally.  Heart with regular rate and rhythm.  Abdomen soft, positive bowel sounds, nontender.  No lower extremity edema.  Imaging: DG Bone Survey Met  Result Date: 03/03/2023 CLINICAL DATA:  MGUS, questionable multiple myeloma, Sjogren syndrome EXAM: METASTATIC BONE SURVEY COMPARISON:  None Available. FINDINGS: No evidence of suspicious lytic or sclerotic lesions in the ribs, clavicles, spine, calvarium, extremity long bones or pelvic girdle. No fractures. No radiopaque foreign bodies. No malalignment. Nonobstructive bowel gas pattern. No active cardiopulmonary disease. IMPRESSION: No evidence of suspicious lytic or sclerotic bone lesions. Electronically Signed   By: Delbert Phenix M.D.   On: 03/03/2023 11:30    Labs:  CBC: Recent Labs  10/28/22 1257 10/28/22 2040 02/21/23 1224  WBC 9.8  --  4.1  HGB 11.5* 11.7* 11.7*  HCT 35.0* 35.4* 36.0  PLT 272  --  281    COAGS: No results for input(s): "INR", "APTT" in the last 8760 hours.  BMP: Recent Labs    10/28/22 1257 02/21/23 1224  NA 133* 133*  K 3.8 3.6  CL 106 107  CO2 19* 22  GLUCOSE 108* 84  BUN 14 19  CALCIUM 8.9 9.0  CREATININE 0.76 0.80  GFRNONAA >60 >60    LIVER FUNCTION TESTS: Recent Labs    10/28/22 1257 02/21/23 1224  BILITOT 0.5 0.5  AST 22 19  ALT 15 14  ALKPHOS 35* 38  PROT 9.4* 9.6*   ALBUMIN 3.8 3.9    TUMOR MARKERS: No results for input(s): "AFPTM", "CEA", "CA199", "CHROMGRNA" in the last 8760 hours.  Assessment and Plan: 47 y.o. female past medical history of Sjogren's syndrome  who presents now with positive monoclonal gammopathy noted on recent blood work by her rheumatologist.  Other testing by oncology has revealed elevated IgG as well as elevated light chains. She  is scheduled today for CT-guided bone marrow biopsy for further evaluation.Risks and benefits of procedure was discussed with the patient  including, but not limited to bleeding, infection, damage to adjacent structures or low yield requiring additional tests.  All of the questions were answered and there is agreement to proceed.  Consent signed and in chart.    Thank you for this interesting consult.  I greatly enjoyed meeting Dietra Mast and look forward to participating in their care.  A copy of this report was sent to the requesting provider on this date.  Electronically Signed: D. Jeananne Rama, PA-C 03/28/2023, 1:21 PM   I spent a total of  20 minutes   in face to face in clinical consultation, greater than 50% of which was counseling/coordinating care for CT guided bone marrow biopsy

## 2023-03-28 NOTE — Telephone Encounter (Signed)
Called pt and advised per last OV note (I also discussed with patient that colon cancer screening starts at age 46. I do recommend consulting with GI about colon cancer screening. Placed referral for this today) pt states she doesn't think it is necessary to see GI due to no issues and wanting to complete Cologuard. Pt states no family hx of colon cancer and came in to see PCP due to possible food poisoning. Please advise if ok to place order for Cologuard

## 2023-03-29 ENCOUNTER — Ambulatory Visit (HOSPITAL_COMMUNITY)
Admission: RE | Admit: 2023-03-29 | Discharge: 2023-03-29 | Disposition: A | Discharge status: Home / Self Care | Payer: BC Managed Care – PPO | Source: Ambulatory Visit | Attending: Internal Medicine | Admitting: Internal Medicine

## 2023-03-29 ENCOUNTER — Other Ambulatory Visit: Payer: Self-pay

## 2023-03-29 ENCOUNTER — Encounter (HOSPITAL_COMMUNITY): Payer: Self-pay

## 2023-03-29 DIAGNOSIS — M3500 Sicca syndrome, unspecified: Secondary | ICD-10-CM | POA: Diagnosis not present

## 2023-03-29 DIAGNOSIS — D472 Monoclonal gammopathy: Secondary | ICD-10-CM | POA: Diagnosis not present

## 2023-03-29 DIAGNOSIS — C9000 Multiple myeloma not having achieved remission: Secondary | ICD-10-CM | POA: Insufficient documentation

## 2023-03-29 LAB — CBC WITH DIFFERENTIAL/PLATELET
Abs Immature Granulocytes: 0.02 10*3/uL (ref 0.00–0.07)
Basophils Absolute: 0.1 10*3/uL (ref 0.0–0.1)
Basophils Relative: 1 %
Eosinophils Absolute: 0.1 10*3/uL (ref 0.0–0.5)
Eosinophils Relative: 1 %
HCT: 38.3 % (ref 36.0–46.0)
Hemoglobin: 12.2 g/dL (ref 12.0–15.0)
Immature Granulocytes: 1 %
Lymphocytes Relative: 24 %
Lymphs Abs: 1 10*3/uL (ref 0.7–4.0)
MCH: 28 pg (ref 26.0–34.0)
MCHC: 31.9 g/dL (ref 30.0–36.0)
MCV: 88 fL (ref 80.0–100.0)
Monocytes Absolute: 0.4 10*3/uL (ref 0.1–1.0)
Monocytes Relative: 9 %
Neutro Abs: 2.6 10*3/uL (ref 1.7–7.7)
Neutrophils Relative %: 64 %
Platelets: 255 10*3/uL (ref 150–400)
RBC: 4.35 MIL/uL (ref 3.87–5.11)
RDW: 13.1 % (ref 11.5–15.5)
WBC: 4 10*3/uL (ref 4.0–10.5)
nRBC: 0 % (ref 0.0–0.2)

## 2023-03-29 MED ORDER — SODIUM CHLORIDE 0.9 % IV SOLN: INTRAVENOUS | Status: DC

## 2023-03-29 MED ORDER — MIDAZOLAM HCL 2 MG/2ML IJ SOLN
INTRAMUSCULAR | Status: AC
  Filled 2023-03-29: qty 2

## 2023-03-29 MED ORDER — HYDROCODONE-ACETAMINOPHEN 5-325 MG PO TABS: 1.0000 | ORAL_TABLET | ORAL | Status: DC | PRN

## 2023-03-29 MED ORDER — MIDAZOLAM HCL 2 MG/2ML IJ SOLN
INTRAMUSCULAR | Status: AC | PRN
  Administered 2023-03-29: 1 mg via INTRAVENOUS

## 2023-03-29 MED ORDER — FENTANYL CITRATE (PF) 100 MCG/2ML IJ SOLN
INTRAMUSCULAR | Status: AC | PRN
  Administered 2023-03-29: 25 ug via INTRAVENOUS

## 2023-03-29 MED ORDER — FENTANYL CITRATE (PF) 100 MCG/2ML IJ SOLN
INTRAMUSCULAR | Status: AC
  Filled 2023-03-29: qty 2

## 2023-03-29 NOTE — Procedures (Signed)
  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 Postprocedure diagnosis: same EBL:    minimal Complications:   none immediate  See full dictation in [employer].  Thora Lance MD Main # [phone] Pager  [phone] Mobile [phone]

## 2023-03-29 NOTE — Discharge Instructions (Signed)
Please call Interventional Radiology clinic [phone] with any questions or concerns. ? ?You may remove your dressing and shower tomorrow. ? ? ?Bone Marrow Aspiration and Bone Marrow Biopsy, Adult, Care After ?This sheet gives you information about how to care for yourself after your procedure. Your health care provider may also give you more specific instructions. If you have problems or questions, contact your health care provider. ?What can I expect after the procedure? ?After the procedure, it is common to have: ?Mild pain and tenderness. ?Swelling. ?Bruising. ?Follow these instructions at home: ?Puncture site care ?Follow instructions from your health care provider about how to take care of the puncture site. Make sure you: ?Wash your hands with soap and water before and after you change your bandage (dressing). If soap and water are not available, use hand sanitizer. ?Change your dressing as told by your health care provider. ?Check your puncture site every day for signs of infection. Check for: ?More redness, swelling, or pain. ?Fluid or blood. ?Warmth. ?Pus or a bad smell.   ?Activity ?Return to your normal activities as told by your health care provider. Ask your health care provider what activities are safe for you. ?Do not lift anything that is heavier than 10 lb (4.5 kg), or the limit that you are told, until your health care provider says that it is safe. ?Do not drive for 24 hours if you were given a sedative during your procedure. ?General instructions ?Take over-the-counter and prescription medicines only as told by your health care provider. ?Do not take baths, swim, or use a hot tub until your health care provider approves. Ask your health care provider if you may take showers. You may only be allowed to take sponge baths. ?If directed, put ice on the affected area. To do this: ?Put ice in a plastic bag. ?Place a towel between your skin and the bag. ?Leave the ice on for 20 minutes, 2-3 times a  day. ?Keep all follow-up visits as told by your health care provider. This is important.   ?Contact a health care provider if: ?Your pain is not controlled with medicine. ?You have a fever. ?You have more redness, swelling, or pain around the puncture site. ?You have fluid or blood coming from the puncture site. ?Your puncture site feels warm to the touch. ?You have pus or a bad smell coming from the puncture site. ?Summary ?After the procedure, it is common to have mild pain, tenderness, swelling, and bruising. ?Follow instructions from your health care provider about how to take care of the puncture site and what activities are safe for you. ?Take over-the-counter and prescription medicines only as told by your health care provider. ?Contact a health care provider if you have any signs of infection, such as fluid or blood coming from the puncture site. ?This information is not intended to replace advice given to you by your health care provider. Make sure you discuss any questions you have with your health care provider. ?Document Revised: 03/17/2019 Document Reviewed: 03/17/2019 ?Elsevier Patient Education ? 2021 Elsevier Inc. ? ? ?Moderate Conscious Sedation, Adult, Care After ?This sheet gives you information about how to care for yourself after your procedure. Your health care provider may also give you more specific instructions. If you have problems or questions, contact your health care provider. ?What can I expect after the procedure? ?After the procedure, it is common to have: ?Sleepiness for several hours. ?Impaired judgment for several hours. ?Difficulty with balance. ?Vomiting if you eat too   soon. ?Follow these instructions at home: ?For the time period you were told by your health care provider: ?Rest. ?Do not participate in activities where you could fall or become injured. ?Do not drive or use machinery. ?Do not drink alcohol. ?Do not take sleeping pills or medicines that cause drowsiness. ?Do not  make important decisions or sign legal documents. ?Do not take care of children on your own.  ?  ?  ?Eating and drinking ?Follow the diet recommended by your health care provider. ?Drink enough fluid to keep your urine pale yellow. ?If you vomit: ?Drink water, juice, or soup when you can drink without vomiting. ?Make sure you have little or no nausea before eating solid foods.   ?General instructions ?Take over-the-counter and prescription medicines only as told by your health care provider. ?Have a responsible adult stay with you for the time you are told. It is important to have someone help care for you until you are awake and alert. ?Do not smoke. ?Keep all follow-up visits as told by your health care provider. This is important. ?Contact a health care provider if: ?You are still sleepy or having trouble with balance after 24 hours. ?You feel light-headed. ?You keep feeling nauseous or you keep vomiting. ?You develop a rash. ?You have a fever. ?You have redness or swelling around the IV site. ?Get help right away if: ?You have trouble breathing. ?You have new-onset confusion at home. ?Summary ?After the procedure, it is common to feel sleepy, have impaired judgment, or feel nauseous if you eat too soon. ?Rest after you get home. Know the things you should not do after the procedure. ?Follow the diet recommended by your health care provider and drink enough fluid to keep your urine pale yellow. ?Get help right away if you have trouble breathing or new-onset confusion at home. ?This information is not intended to replace advice given to you by your health care provider. Make sure you discuss any questions you have with your health care provider. ?Document Revised: 02/26/2020 Document Reviewed: 09/24/2019 ?Elsevier Patient Education ? 2021 Elsevier Inc.  ?

## 2023-04-02 ENCOUNTER — Encounter: Payer: Self-pay | Admitting: Internal Medicine

## 2023-04-02 LAB — SURGICAL PATHOLOGY

## 2023-04-08 NOTE — Progress Notes (Unsigned)
[facility] OFFICE PROGRESS NOTE  Allwardt, Crist Infante, PA-C [address] 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 female returns to the clinic today for a follow up visit. The patient was referred to Dr. Arbutus Ped and established care on 02/21/23 after her rheumatologist ordered SPEP which showed monoclonal gammography. Of note, she has been treated for Sjogren's for 25 years. Dr. Arbutus Ped recommended skeletal survey, which was negative for any lytic lesions. He ordered a 24 hour urine protein which she has received the supplies but has not completed at this time. She has a very busy lifestyle with work and taking her daughter to extracurricular activities so it is difficult to completed the 24 hour urine. She also had a bone marrow biopsy and aspirate to rule out lymphoma or myeloproliferative disorder.   Since last being seen, she denies any major changes in her health. She denies fevers, chills, night sweat, lymphadenopathy, or unexplained weight loss. Denies chest pain, shortness of breath, cough, or hemoptysis. Denies abdominal pain. Denies early satiety. She denies abnormal bleeding or bruising. Denies signs or symptoms of infection. She is here to review her scan results and bone marrow biopsy results.      MEDICAL HISTORY: Past Medical History:  Diagnosis Date   Sjogren's syndrome (HCC)     ALLERGIES:  is allergic to dust mite extract and mixed feathers.  MEDICATIONS:  Current Outpatient Medications  Medication Sig Dispense Refill   Cholecalciferol (VITAMIN D-3 PO) Take 1 tablet by mouth daily.     ascorbic acid (VITAMIN C) 1000 MG tablet Take 1,000 mg by mouth daily.     hydroxychloroquine (PLAQUENIL) 200 MG tablet Take by mouth 2 (two) times daily.     No current facility-administered medications for this visit.    SURGICAL HISTORY:   Past Surgical History:  Procedure Laterality Date   BREAST SURGERY     NO PAST SURGERIES      REVIEW OF SYSTEMS:   Review of Systems  Constitutional: Negative for appetite change, chills, fatigue, fever and unexpected weight change.  HENT: Negative for mouth sores, nosebleeds, sore throat and trouble swallowing.   Eyes: Negative for eye problems and icterus.  Respiratory: Negative for cough, hemoptysis, shortness of breath and wheezing.   Cardiovascular: Negative for chest pain and leg swelling.  Gastrointestinal: Negative for abdominal pain, constipation, diarrhea, nausea and vomiting.  Genitourinary: Negative for bladder incontinence, difficulty urinating, dysuria, frequency and hematuria.   Musculoskeletal: Negative for back pain, gait problem, neck pain and neck stiffness.  Skin: Negative for itching and rash.  Neurological: Negative for dizziness, extremity weakness, gait problem, headaches, light-headedness and seizures.  Hematological: Negative for adenopathy. Does not bruise/bleed easily.  Psychiatric/Behavioral: Negative for confusion, depression and sleep disturbance. The patient is not nervous/anxious.     PHYSICAL EXAMINATION:  Blood pressure 134/84, pulse 75, temperature 98.6 F (37 C), temperature source Oral, weight 121 lb 3.2 oz (55 kg), last menstrual period 03/21/2023, SpO2 100 %.  ECOG PERFORMANCE STATUS: 0  Physical Exam  Constitutional: Oriented to person, place, and time and well-developed, well-nourished, and in no distress.  HENT:  Head: Normocephalic and atraumatic.  Mouth/Throat: Oropharynx is clear and moist. No oropharyngeal exudate.  Eyes: Conjunctivae are normal. Right eye exhibits no discharge. Left eye exhibits no discharge. No scleral icterus.  Neck: Normal range of motion. Neck supple.  Cardiovascular: Normal rate,  regular rhythm, normal heart sounds and intact distal pulses.   Pulmonary/Chest: Effort normal and breath sounds normal. No  respiratory distress. No wheezes. No rales.  Abdominal: Soft. Bowel sounds are normal. Exhibits no distension and no mass. There is no tenderness.  Musculoskeletal: Normal range of motion. Exhibits no edema.  Lymphadenopathy:    No cervical adenopathy.  Neurological: Alert and oriented to person, place, and time. Exhibits normal muscle tone. Gait normal. Coordination normal.  Skin: Skin is warm and dry. No rash noted. Not diaphoretic. No erythema. No pallor.  Psychiatric: Mood, memory and judgment normal.  Vitals reviewed.  LABORATORY DATA: Lab Results  Component Value Date   WBC 4.6 04/11/2023   HGB 12.1 04/11/2023   HCT 37.6 04/11/2023   MCV 87.2 04/11/2023   PLT 265 04/11/2023      Chemistry      Component Value Date/Time   NA 134 (L) 04/11/2023 0815   K 4.2 04/11/2023 0815   CL 107 04/11/2023 0815   CO2 23 04/11/2023 0815   BUN 20 04/11/2023 0815   CREATININE 0.88 04/11/2023 0815   CREATININE 0.96 05/23/2015 1505      Component Value Date/Time   CALCIUM 8.8 (L) 04/11/2023 0815   ALKPHOS 38 04/11/2023 0815   AST 21 04/11/2023 0815   ALT 15 04/11/2023 0815   BILITOT 0.5 04/11/2023 0815       RADIOGRAPHIC STUDIES:  CT BONE MARROW BIOPSY & ASPIRATION  Result Date: 03/29/2023 CLINICAL DATA:  monoclonal gammopathy of unknown significance EXAM: CT GUIDED DEEP ILIAC BONE ASPIRATION AND CORE BIOPSY TECHNIQUE: Patient was placed prone on the CT gantry and limited axial scans through the pelvis were obtained. This exam was performed according to the departmental dose-optimization program which includes automated exposure control, adjustment of the mA and/or kV according to patient size and/or use of iterative reconstruction technique. Appropriate skin entry site was identified. Skin site was marked, prepped with chlorhexidine, draped in usual sterile fashion, and infiltrated locally with 1% lidocaine. Intravenous Fentanyl and Versed 25mg  were administered as conscious  sedation during continuous monitoring of the patient's level of consciousness and physiological / cardiorespiratory status by the radiology RN, with a total moderate sedation time of 10 minutes. Under CT fluoroscopic guidance an 11-gauge Cook trocar bone needle was advanced into the Right iliac bone just lateral to the sacroiliac joint. Once needle tip position was confirmed, core and aspiration samples were obtained, submitted to pathology for approval. Patient tolerated procedure well. COMPLICATIONS: COMPLICATIONS none IMPRESSION: 1. Technically successful CT guided right iliac bone core and aspiration biopsy. Electronically Signed   By: Corlis Leak M.D.   On: 03/29/2023 12:19     ASSESSMENT/PLAN:  This is a very pleasant 47 years old Hispanic female with history of Sjogren syndrome and was found recently to have monoclonal gammopathy on routine blood work by her rheumatologist. The patient is currently asymptomatic and feeling well.   She had a skeletal survey and bone marrow biopsy performed recently. The skeletal surgery was negative.   The patient was seen with Dr. Arbutus Ped who personally and independently reviewed the results and discussed them with the patient. The bone marrow biopsy did not show any significant blastic population or diagnostic evidence of plasma cell neoplasm. There is a minor B cell population with lambda light chain excess but the findings are limited but cannot exclude early involvement of monoclonal b cell lymphocytosis, which we will monitor.   Dr. Arbutus Ped recommends she continue on observation with  close monitoring.   We will see her back for labs and follow up in 6 months.   Dr. Arbutus Ped thinks it still would be a good idea to perform the 24 hour urine just to be on the safe side.   We will fax her records and office note to her rheumatologist per patient request.   The patient was advised to call immediately if she has any concerning symptoms in the interval. The  patient voices understanding of current disease status and treatment options and is in agreement with the current care plan. All questions were answered. The patient knows to call the clinic with any problems, questions or concerns. We can certainly see the patient much sooner if necessary   Orders Placed This Encounter  Procedures   CMP (Cancer Center only)    Standing Status:   Future    Standing Expiration Date:   04/10/2024   CBC with Differential (Cancer Center Only)    Standing Status:   Future    Standing Expiration Date:   04/10/2024   Lactate dehydrogenase (LDH)    Standing Status:   Future    Standing Expiration Date:   04/10/2024   Beta 2 microglobulin    Standing Status:   Future    Standing Expiration Date:   04/10/2024   Kappa/lambda light chains    Standing Status:   Future    Standing Expiration Date:   04/10/2024   QIG  (Quant. immunoglobulins  - IgG, IgA, IgM)    Standing Status:   Future    Standing Expiration Date:   04/10/2024      Johnette Abraham Nasser Ku, PA-C 04/11/23  ADDENDUM: Hematology/Oncology Attending: I had a face-to-face encounter with the patient today.  I reviewed her lab, biopsy results and recommended her care plan.  This is a very pleasant 47 years old female with history of Sjogren syndrome followed by rheumatology and she was found on SPEP to have monoclonal gammopathy.  The patient had extensive evaluation for her condition and she continues to have elevated IgG.  I will order skeletal bone survey as well as bone marrow biopsy and aspirate that were performed recently and showed no concerning findings for underlying multiple myeloma. The elevated monoclonal gammopathy is likely secondary to her rheumatological disorder but I also advised the patient to have 24-hour protein electrophoresis performed to rule out any other abnormality.  I discussed the biopsy result with the patient and recommended for her to continue on observation with repeat  myeloma panel in 6 months but she was advised to call immediately if she has any other concerning symptoms in the interval.  She will continue her routine follow-up visit and evaluation by her rheumatologist. The patient was advised to call immediately if she has any concerning symptoms in the interval. Disclaimer: This note was dictated with voice recognition software. Similar sounding words can inadvertently be transcribed and may be missed upon review. Lajuana Matte, MD

## 2023-04-10 ENCOUNTER — Encounter (HOSPITAL_COMMUNITY): Payer: Self-pay | Admitting: Internal Medicine

## 2023-04-10 ENCOUNTER — Other Ambulatory Visit: Payer: Self-pay | Admitting: Physician Assistant

## 2023-04-10 DIAGNOSIS — D472 Monoclonal gammopathy: Secondary | ICD-10-CM

## 2023-04-11 ENCOUNTER — Inpatient Hospital Stay (HOSPITAL_BASED_OUTPATIENT_CLINIC_OR_DEPARTMENT_OTHER): Payer: BC Managed Care – PPO | Admitting: Physician Assistant

## 2023-04-11 ENCOUNTER — Inpatient Hospital Stay: Payer: BC Managed Care – PPO | Attending: Internal Medicine

## 2023-04-11 VITALS — BP 134/84 | HR 75 | Temp 98.6°F | Wt 121.2 lb

## 2023-04-11 DIAGNOSIS — M3500 Sicca syndrome, unspecified: Secondary | ICD-10-CM | POA: Insufficient documentation

## 2023-04-11 DIAGNOSIS — D472 Monoclonal gammopathy: Secondary | ICD-10-CM | POA: Insufficient documentation

## 2023-04-11 DIAGNOSIS — R768 Other specified abnormal immunological findings in serum: Secondary | ICD-10-CM | POA: Insufficient documentation

## 2023-04-11 LAB — CBC WITH DIFFERENTIAL (CANCER CENTER ONLY)
Abs Immature Granulocytes: 0.01 10*3/uL (ref 0.00–0.07)
Basophils Absolute: 0.1 10*3/uL (ref 0.0–0.1)
Basophils Relative: 1 %
Eosinophils Absolute: 0.1 10*3/uL (ref 0.0–0.5)
Eosinophils Relative: 1 %
HCT: 37.6 % (ref 36.0–46.0)
Hemoglobin: 12.1 g/dL (ref 12.0–15.0)
Immature Granulocytes: 0 %
Lymphocytes Relative: 24 %
Lymphs Abs: 1.1 10*3/uL (ref 0.7–4.0)
MCH: 28.1 pg (ref 26.0–34.0)
MCHC: 32.2 g/dL (ref 30.0–36.0)
MCV: 87.2 fL (ref 80.0–100.0)
Monocytes Absolute: 0.4 10*3/uL (ref 0.1–1.0)
Monocytes Relative: 9 %
Neutro Abs: 3 10*3/uL (ref 1.7–7.7)
Neutrophils Relative %: 65 %
Platelet Count: 265 10*3/uL (ref 150–400)
RBC: 4.31 MIL/uL (ref 3.87–5.11)
RDW: 12.6 % (ref 11.5–15.5)
WBC Count: 4.6 10*3/uL (ref 4.0–10.5)
nRBC: 0 % (ref 0.0–0.2)

## 2023-04-11 LAB — CMP (CANCER CENTER ONLY)
ALT: 15 U/L (ref 0–44)
AST: 21 U/L (ref 15–41)
Albumin: 3.9 g/dL (ref 3.5–5.0)
Alkaline Phosphatase: 38 U/L (ref 38–126)
Anion gap: 4 — ABNORMAL LOW (ref 5–15)
BUN: 20 mg/dL (ref 6–20)
CO2: 23 mmol/L (ref 22–32)
Calcium: 8.8 mg/dL — ABNORMAL LOW (ref 8.9–10.3)
Chloride: 107 mmol/L (ref 98–111)
Creatinine: 0.88 mg/dL (ref 0.44–1.00)
GFR, Estimated: >60 mL/min (ref >60)
Glucose, Bld: 85 mg/dL (ref 70–99)
Potassium: 4.2 mmol/L (ref 3.5–5.1)
Sodium: 134 mmol/L — ABNORMAL LOW (ref 135–145)
Total Bilirubin: 0.5 mg/dL (ref 0.3–1.2)
Total Protein: 9.3 g/dL — ABNORMAL HIGH (ref 6.5–8.1)

## 2023-04-17 DIAGNOSIS — Z20822 Contact with and (suspected) exposure to covid-19: Secondary | ICD-10-CM | POA: Diagnosis not present

## 2023-04-17 DIAGNOSIS — J029 Acute pharyngitis, unspecified: Secondary | ICD-10-CM | POA: Diagnosis not present

## 2023-04-17 DIAGNOSIS — U071 COVID-19: Secondary | ICD-10-CM | POA: Diagnosis not present

## 2023-05-01 ENCOUNTER — Ambulatory Visit: Payer: Self-pay | Admitting: Diagnostic Neuroimaging

## 2023-05-23 ENCOUNTER — Ambulatory Visit: Payer: BC Managed Care – PPO | Admitting: Internal Medicine

## 2023-05-23 ENCOUNTER — Other Ambulatory Visit: Payer: BC Managed Care – PPO

## 2023-06-23 DIAGNOSIS — K130 Diseases of lips: Secondary | ICD-10-CM | POA: Diagnosis not present

## 2023-06-23 DIAGNOSIS — S0993XA Unspecified injury of face, initial encounter: Secondary | ICD-10-CM | POA: Diagnosis not present

## 2023-07-18 DIAGNOSIS — J343 Hypertrophy of nasal turbinates: Secondary | ICD-10-CM | POA: Diagnosis not present

## 2023-07-18 DIAGNOSIS — J342 Deviated nasal septum: Secondary | ICD-10-CM | POA: Diagnosis not present

## 2023-07-18 DIAGNOSIS — J0101 Acute recurrent maxillary sinusitis: Secondary | ICD-10-CM | POA: Diagnosis not present

## 2023-08-04 DIAGNOSIS — Z1211 Encounter for screening for malignant neoplasm of colon: Secondary | ICD-10-CM | POA: Diagnosis not present

## 2023-08-08 LAB — COLOGUARD: COLOGUARD: NEGATIVE

## 2023-08-15 ENCOUNTER — Ambulatory Visit (INDEPENDENT_AMBULATORY_CARE_PROVIDER_SITE_OTHER): Payer: BC Managed Care – PPO | Admitting: Otolaryngology

## 2023-08-23 ENCOUNTER — Other Ambulatory Visit: Payer: Self-pay | Admitting: Physician Assistant

## 2023-09-12 ENCOUNTER — Ambulatory Visit (INDEPENDENT_AMBULATORY_CARE_PROVIDER_SITE_OTHER): Payer: BC Managed Care – PPO | Admitting: Otolaryngology

## 2023-09-12 ENCOUNTER — Encounter (INDEPENDENT_AMBULATORY_CARE_PROVIDER_SITE_OTHER): Payer: Self-pay

## 2023-09-12 VITALS — Ht 64.0 in | Wt 120.0 lb

## 2023-09-12 DIAGNOSIS — J324 Chronic pansinusitis: Secondary | ICD-10-CM | POA: Insufficient documentation

## 2023-09-12 DIAGNOSIS — J329 Chronic sinusitis, unspecified: Secondary | ICD-10-CM | POA: Diagnosis not present

## 2023-09-12 DIAGNOSIS — J343 Hypertrophy of nasal turbinates: Secondary | ICD-10-CM | POA: Diagnosis not present

## 2023-09-12 DIAGNOSIS — J342 Deviated nasal septum: Secondary | ICD-10-CM

## 2023-09-12 DIAGNOSIS — R0981 Nasal congestion: Secondary | ICD-10-CM

## 2023-09-15 NOTE — Progress Notes (Signed)
Patient ID: Jillian Macias, female   DOB: 1975/11/29, 47 y.o.   MRN: [id number]  Follow-up: Recurrent sinusitis, chronic nasal congestion  HPI: The patient is a 47 year old female who returns today for her follow-up evaluation. The patient has a history of recurrent sinusitis and chronic nasal congestion.  At her last visit 1 month ago, the patient was noted to have an acute sinusitis, with erythematous and edematous nasal mucosa.  She was also noted to have nasal septal deviation and bilateral inferior turbinate hypertrophy.  She was treated with levofloxacin and Flonase nasal spray.  She defers on the use of systemic steroid due to the steroid side effects.  The patient returns today complaining of persistent facial pain and pressure.  She also has frequent nasal drainage, resulting in frequent throat clearing.  Exam: General: Communicates without difficulty, well nourished, no acute distress. Head: Normocephalic, no evidence injury, no tenderness, facial buttresses intact without stepoff. Face/sinus: No tenderness to palpation and percussion. Facial movement is normal and symmetric. Eyes: PERRL, EOMI. No scleral icterus, conjunctivae clear. Neuro: CN II exam reveals vision grossly intact.  No nystagmus at any point of gaze. Ears: Auricles well formed without lesions.  Ear canals are intact without mass or lesion.  No erythema or edema is appreciated.  The TMs are intact without fluid. Nose: External evaluation reveals normal support and skin without lesions.  Dorsum is intact.  Anterior rhinoscopy reveals congested mucosa over anterior aspect of inferior turbinates and deviated septum.  No purulence noted. Oral:  Oral cavity and oropharynx are intact, symmetric, without erythema or edema.  Mucosa is moist without lesions. Neck: Full range of motion without pain.  There is no significant lymphadenopathy.  No masses palpable.  Thyroid bed within normal limits to palpation.  Parotid glands and submandibular  glands equal bilaterally without mass.  Trachea is midline. Neuro:  CN 2-12 grossly intact.    Assessment: 1.  Recurrent rhinosinusitis, with nasal mucosal congestion, nasal septal deviation, and bilateral inferior turbinate hypertrophy. 2.  No obvious purulent drainage is noted today.  Plan: 1.  The physical exam findings are reviewed with the patient. 2.  Continue with Flonase nasal spray 2 sprays each nostril daily. 3.  Sinus CT scan to evaluate for possible chronic rhinosinusitis. 4.  The patient will return for reevaluation after her sinus CT scan.  If she continues to be symptomatic, she may benefit from surgical intervention with septoplasty, turbinate reduction, and possible endoscopic sinus surgery.

## 2023-09-18 DIAGNOSIS — Z01419 Encounter for gynecological examination (general) (routine) without abnormal findings: Secondary | ICD-10-CM | POA: Diagnosis not present

## 2023-09-18 DIAGNOSIS — Z1231 Encounter for screening mammogram for malignant neoplasm of breast: Secondary | ICD-10-CM | POA: Diagnosis not present

## 2023-09-18 DIAGNOSIS — Z1331 Encounter for screening for depression: Secondary | ICD-10-CM | POA: Diagnosis not present

## 2023-09-26 ENCOUNTER — Other Ambulatory Visit (HOSPITAL_BASED_OUTPATIENT_CLINIC_OR_DEPARTMENT_OTHER): Payer: Self-pay | Admitting: Physician Assistant

## 2023-09-26 DIAGNOSIS — R221 Localized swelling, mass and lump, neck: Secondary | ICD-10-CM | POA: Diagnosis not present

## 2023-09-26 DIAGNOSIS — T7840XA Allergy, unspecified, initial encounter: Secondary | ICD-10-CM | POA: Diagnosis not present

## 2023-09-26 DIAGNOSIS — Z789 Other specified health status: Secondary | ICD-10-CM

## 2023-10-16 ENCOUNTER — Other Ambulatory Visit: Payer: BC Managed Care – PPO

## 2023-10-16 ENCOUNTER — Ambulatory Visit (HOSPITAL_BASED_OUTPATIENT_CLINIC_OR_DEPARTMENT_OTHER): Payer: BC Managed Care – PPO

## 2023-10-23 ENCOUNTER — Ambulatory Visit: Payer: BC Managed Care – PPO | Admitting: Internal Medicine

## 2023-10-24 ENCOUNTER — Ambulatory Visit (HOSPITAL_BASED_OUTPATIENT_CLINIC_OR_DEPARTMENT_OTHER)
Admission: RE | Admit: 2023-10-24 | Discharge: 2023-10-24 | Disposition: A | Discharge status: Home / Self Care | Payer: BC Managed Care – PPO | Source: Ambulatory Visit | Attending: Physician Assistant | Admitting: Physician Assistant

## 2023-10-24 ENCOUNTER — Ambulatory Visit (HOSPITAL_BASED_OUTPATIENT_CLINIC_OR_DEPARTMENT_OTHER)
Admission: RE | Admit: 2023-10-24 | Discharge: 2023-10-24 | Disposition: A | Discharge status: Home / Self Care | Payer: BC Managed Care – PPO | Source: Ambulatory Visit | Attending: Otolaryngology | Admitting: Otolaryngology

## 2023-10-24 DIAGNOSIS — J329 Chronic sinusitis, unspecified: Secondary | ICD-10-CM | POA: Diagnosis not present

## 2023-10-24 DIAGNOSIS — J324 Chronic pansinusitis: Secondary | ICD-10-CM | POA: Diagnosis not present

## 2023-10-24 DIAGNOSIS — Z789 Other specified health status: Secondary | ICD-10-CM | POA: Insufficient documentation

## 2023-10-24 DIAGNOSIS — M4319 Spondylolisthesis, multiple sites in spine: Secondary | ICD-10-CM | POA: Diagnosis not present

## 2023-10-31 ENCOUNTER — Encounter (INDEPENDENT_AMBULATORY_CARE_PROVIDER_SITE_OTHER): Payer: Self-pay

## 2023-10-31 ENCOUNTER — Ambulatory Visit (INDEPENDENT_AMBULATORY_CARE_PROVIDER_SITE_OTHER): Payer: BC Managed Care – PPO

## 2023-10-31 VITALS — Ht 64.0 in | Wt 120.0 lb

## 2023-10-31 DIAGNOSIS — J320 Chronic maxillary sinusitis: Secondary | ICD-10-CM | POA: Diagnosis not present

## 2023-10-31 DIAGNOSIS — J342 Deviated nasal septum: Secondary | ICD-10-CM | POA: Diagnosis not present

## 2023-10-31 DIAGNOSIS — J322 Chronic ethmoidal sinusitis: Secondary | ICD-10-CM | POA: Diagnosis not present

## 2023-10-31 DIAGNOSIS — J338 Other polyp of sinus: Secondary | ICD-10-CM

## 2023-10-31 DIAGNOSIS — J343 Hypertrophy of nasal turbinates: Secondary | ICD-10-CM | POA: Diagnosis not present

## 2023-10-31 DIAGNOSIS — J323 Chronic sphenoidal sinusitis: Secondary | ICD-10-CM

## 2023-11-01 DIAGNOSIS — J338 Other polyp of sinus: Secondary | ICD-10-CM | POA: Insufficient documentation

## 2023-11-01 DIAGNOSIS — J320 Chronic maxillary sinusitis: Secondary | ICD-10-CM | POA: Insufficient documentation

## 2023-11-01 DIAGNOSIS — J322 Chronic ethmoidal sinusitis: Secondary | ICD-10-CM | POA: Insufficient documentation

## 2023-11-01 DIAGNOSIS — J323 Chronic sphenoidal sinusitis: Secondary | ICD-10-CM | POA: Insufficient documentation

## 2023-11-03 NOTE — Progress Notes (Signed)
Patient ID: Jillian Macias, female   DOB: 06/30/1976, 47 y.o.   MRN: [id number]  Follow-up: Chronic rhinosinusitis, chronic nasal obstruction  HPI: The patient is a 47 year old female who returns today for her follow-up evaluation.  The patient was previously seen for recurrent rhinosinusitis and chronic nasal obstruction.  She was noted to have frequent recurrent sinusitis, with erythematous and edematous nasal mucosa at her previous visits.  She was also noted to have nasal septal deviation and bilateral inferior turbinate hypertrophy, causing obstruction of her nasal passageways bilaterally.  She was treated with multiple courses of antibiotics and steroid nasal sprays.  Despite the medical treatment, she continues to have chronic facial pain and pressure, nasal drainage, and chronic nasal obstruction.  She recently underwent a sinus CT scan.  The CT showed bilateral chronic maxillary, ethmoid, and sphenoid sinus diseases, in addition to the nasal septal deviation and bilateral inferior turbinate hypertrophy.  The patient returns today seeking more definitive treatment of her chronic conditions.  Exam: General: Communicates without difficulty, well nourished, no acute distress. Head: Normocephalic, no evidence injury, no tenderness, facial buttresses intact without stepoff. Face/sinus: No tenderness to palpation and percussion. Facial movement is normal and symmetric. Eyes: PERRL, EOMI. No scleral icterus, conjunctivae clear. Neuro: CN II exam reveals vision grossly intact.  No nystagmus at any point of gaze. Ears: Auricles well formed without lesions.  Ear canals are intact without mass or lesion.  No erythema or edema is appreciated.  The TMs are intact without fluid. Nose: External evaluation reveals normal support and skin without lesions.  Dorsum is intact.  Anterior rhinoscopy reveals congested mucosa over anterior aspect of inferior turbinates and deviated septum.   Oral:  Oral cavity and oropharynx  are intact, symmetric, without erythema or edema.  Mucosa is moist without lesions. Neck: Full range of motion without pain.  There is no significant lymphadenopathy.  No masses palpable.  Thyroid bed within normal limits to palpation.  Parotid glands and submandibular glands equal bilaterally without mass.  Trachea is midline. Neuro:  CN 2-12 grossly intact.   Assessment: 1.  Chronic rhinosinusitis, involving bilateral maxillary, ethmoid, and sphenoid sinuses. 2.  Nasal septal deviation and bilateral inferior turbinate hypertrophy.  More than 95% of her nasal passageways are obstructed bilaterally. 3.  The patient has not responded to treatment with multiple courses of antibiotics, allergy medication, and steroid nasal sprays.  Plan: 1.  The physical exam findings and the CT images are reviewed with the patient. 2.  Continue with Flonase nasal spray 2 sprays each nostril daily. 3.  Based on the above findings, the patient will benefit from surgical intervention with septoplasty, bilateral turbinate reduction, and bilateral endoscopic sinus surgery, addressing the maxillary, ethmoid and sphenoid sinuses. 4.  The risk, benefits, alternatives, and details of the procedures are extensively discussed.  Questions are invited and answered. 5.  The patient would like to proceed with the procedures.  We will schedule the procedure in accordance with the family schedule.

## 2023-12-05 ENCOUNTER — Ambulatory Visit: Payer: Self-pay | Admitting: Physician Assistant

## 2023-12-05 NOTE — Telephone Encounter (Signed)
Chief Complaint: Flu-like symptoms Symptoms: Fever, cough, irritated throat, congestion Frequency: 2 days Disposition: [] ED /[] Urgent Care (no appt availability in office) / [] Appointment(In office/virtual)/ []  [facility]/ [x] Home Care/ [] Refused Recommended Disposition /[] [place] [facility]/ []  Follow-up with PCP Additional Notes: Patient called in stating she was experiencing flu-like symptoms. Patient states her 47 year old daughter just had these exact same symptoms for 3 days. Patient states she took a home covid test that was negative. Patient states her last temperature reading was 102 this morning, and she has been treating fever with tylenol and dayquil. Patient is also experiencing a cough and congestion. Educated patient on home care instructions and s/s to watch out for, along with when to call back for further evaluation.    Answer Assessment - Initial Assessment Questions 1. TEMPERATURE: "What is the most recent temperature?"  "How was it measured?"      102 - this morning 2. ONSET: "When did the fever start?"      Two days ago 3. CHILLS: "Do you have chills?" If yes: "How bad are they?"  (e.g., none, mild, moderate, severe)   - NONE: no chills   - MILD: feeling cold   - MODERATE: feeling very cold, some shivering (feels better under a thick blanket)   - SEVERE: feeling extremely cold with shaking chills (general body shaking, rigors; even under a thick blanket)      Mild intermittent 4. OTHER SYMPTOMS: "Do you have any other symptoms besides the fever?"  (e.g., abdomen pain, cough, diarrhea, earache, headache, sore throat, urination pain)     Irritated throat, cough, congestion 5. CAUSE: If there are no symptoms, ask: "What do you think is causing the fever?"      Illness - "I believe I got it from my daughter, she was sick with a fever for 3 days. I think she had the flu" 6. CONTACTS: "Does anyone else in the family have an infection?"     Daughter 7.  TREATMENT: "What have you done so far to treat this fever?" (e.g., medications)     Tylenol, dayquil, cough medication 8. IMMUNOCOMPROMISE: "Do you have of the following: diabetes, HIV positive, splenectomy, cancer chemotherapy, chronic steroid treatment, transplant patient, etc."     Auto-immune disease  Protocols used: Fever-A-AH

## 2023-12-05 NOTE — Telephone Encounter (Signed)
Chief Complaint: Fever, cough Symptoms: Fever, cough, itchy/sore throat,  Frequency:  Pertinent Negatives: Patient denies  Disposition: [] ED /[] Urgent Care (no appt availability in office) / [] Appointment(In office/virtual)/ []  [facility]/ [] Home Care/ [] Refused Recommended Disposition /[] [place]/ []  Follow-up with PCP Additional Notes:    Copied from CRM [id number]. Topic: Clinical - Red Word Triage >> Dec 05, 2023  4:33 PM Thomes Dinning wrote: Red Word that prompted transfer to Nurse Triage: Patient thinks she has the flu. Patient has a constant cough, sneezing, headache chest is burning with a fever. Reason for Disposition  [1] Fever AND [2] no signs of serious infection or localizing symptoms (all other triage questions negative)  Protocols used: [facility]

## 2023-12-05 NOTE — Telephone Encounter (Signed)
Noted  

## 2023-12-07 DIAGNOSIS — R051 Acute cough: Secondary | ICD-10-CM | POA: Diagnosis not present

## 2023-12-07 DIAGNOSIS — J0100 Acute maxillary sinusitis, unspecified: Secondary | ICD-10-CM | POA: Diagnosis not present

## 2023-12-07 DIAGNOSIS — H6993 Unspecified Eustachian tube disorder, bilateral: Secondary | ICD-10-CM | POA: Diagnosis not present

## 2024-03-06 ENCOUNTER — Ambulatory Visit: Admitting: Family

## 2024-04-20 ENCOUNTER — Ambulatory Visit (INDEPENDENT_AMBULATORY_CARE_PROVIDER_SITE_OTHER): Admitting: Otolaryngology

## 2024-04-20 ENCOUNTER — Encounter (INDEPENDENT_AMBULATORY_CARE_PROVIDER_SITE_OTHER): Payer: Self-pay | Admitting: Otolaryngology

## 2024-04-20 VITALS — BP 139/88 | HR 68 | Ht 64.5 in | Wt 118.0 lb

## 2024-04-20 DIAGNOSIS — J342 Deviated nasal septum: Secondary | ICD-10-CM | POA: Diagnosis not present

## 2024-04-20 DIAGNOSIS — R040 Epistaxis: Secondary | ICD-10-CM

## 2024-04-20 DIAGNOSIS — J343 Hypertrophy of nasal turbinates: Secondary | ICD-10-CM

## 2024-04-20 DIAGNOSIS — J338 Other polyp of sinus: Secondary | ICD-10-CM

## 2024-04-20 DIAGNOSIS — J310 Chronic rhinitis: Secondary | ICD-10-CM | POA: Diagnosis not present

## 2024-04-20 DIAGNOSIS — J324 Chronic pansinusitis: Secondary | ICD-10-CM

## 2024-04-20 MED ORDER — AMOXICILLIN-POT CLAVULANATE 875-125 MG PO TABS: 1.0000 | ORAL_TABLET | Freq: Two times a day (BID) | ORAL | 0 refills | Status: AC

## 2024-04-20 MED ORDER — FLUCONAZOLE 150 MG PO TABS: 150.0000 mg | ORAL_TABLET | Freq: Once | ORAL | 0 refills | Status: AC

## 2024-04-22 NOTE — Progress Notes (Signed)
 Patient ID: Jillian Macias, female   DOB: 08-11-1976, 48 y.o.   MRN: [id number]  Follow-up: Chronic rhinosinusitis, chronic nasal obstruction New complaint: Recurrent right epistaxis  HPI: The patient is a 48 year old female who returns today for her follow-up evaluation.  The patient has a history of frequent recurrent rhinosinusitis and chronic nasal obstruction.  She was previously treated with multiple courses of antibiotics, systemic and topical steroids, allergy medications, and nasal irrigation.  Despite the treatment, she continues to be symptomatic.  Her CT scan showed bilateral chronic maxillary, ethmoid, and sphenoid sinus disease cysts.  She also has significant nasal septal deviation and bilateral inferior turbinate hypertrophy, causing bilateral nasal obstruction.  The patient returns today complaining of increasing facial pain and pressure over the past 2 weeks.  She has also noticed swollen eyes, nasal drainage, and increasing breathing difficulty.  In addition, she also has a new complaint of right-sided epistaxis.  She had 3 episode of significant bleeding last week.  She denies any recent nasal trauma.  Exam: General: Communicates without difficulty, well nourished, no acute distress. Head: Normocephalic, no evidence injury, no tenderness, facial buttresses intact without stepoff. Face/sinus: No tenderness to palpation and percussion. Facial movement is normal and symmetric. Eyes: PERRL, EOMI. No scleral icterus, conjunctivae clear. Neuro: CN II exam reveals vision grossly intact.  No nystagmus at any point of gaze. Ears: Auricles well formed without lesions.  Ear canals are intact without mass or lesion.  No erythema or edema is appreciated.  The TMs are intact without fluid. Nose: External evaluation reveals normal support and skin without lesions.  Dorsum is intact.  Anterior rhinoscopy reveals congested mucosa over anterior aspect of inferior turbinates and deviated septum.   Oral:   Oral cavity and oropharynx are intact, symmetric, without erythema or edema.  Mucosa is moist without lesions. Neck: Full range of motion without pain.  There is no significant lymphadenopathy.  No masses palpable.  Thyroid bed within normal limits to palpation.  Parotid glands and submandibular glands equal bilaterally without mass.  Trachea is midline. Neuro:  CN 2-12 grossly intact.   Procedure:  Endoscopic control of recurrent right epistaxis. Indication:  Recurrent epistaxis  Description:  The right nasal cavity is sprayed with topical xylocaine  and neo-synephrine.  After adequate anesthesia is achieved, the nasal cavity is examined with a 0 rigid endoscope.  A suction catheter is inserted in parallel with the 0 endoscope, and it is used to suction blood clots from the nasal cavity.  Several hypervascular areas are noted on the anterior and superior portion of the septum. Active bleeding is noted. A silver nitrate stick is inserted in parallel with the 0 endoscope.  It is used to repeatedly cauterized the hypervascular areas.  Good hemostasis is achieved.  The patient tolerated the procedure well.     Assessment: 1.  Chronic rhinosinusitis with frequent exacerbations, involving bilateral maxillary, ethmoid, and sphenoid sinuses. 2.  Nasal septal deviation and bilateral inferior turbinate hypertrophy.  More than 95% of her nasal passageways are obstructed bilaterally. 3.  Recurrent right epistaxis.  Plan: 1.  The physical exam and nasal endoscopy findings are reviewed with the patient. 2.  Augmentin 875 mg p.o. twice daily for 10 days.  The patient also requested Diflucan  to treat her possible yeast infection. 3.  Endoscopic cauterization of her right nasal septum. 4.  Nasal ointment and humidifier as needed. 5.  Continue with Flonase nasal sprays daily.  The proper technique of using the nasal spray is demonstrated.  6.  The patient is scheduled for bilateral endoscopic sinus surgery,  septoplasty, and turbinate reduction in August 2025. 7.  The patient will return for reevaluation in 1 month.

## 2024-05-06 DIAGNOSIS — R509 Fever, unspecified: Secondary | ICD-10-CM | POA: Diagnosis not present

## 2024-05-06 DIAGNOSIS — J0111 Acute recurrent frontal sinusitis: Secondary | ICD-10-CM | POA: Diagnosis not present

## 2024-05-06 DIAGNOSIS — R399 Unspecified symptoms and signs involving the genitourinary system: Secondary | ICD-10-CM | POA: Diagnosis not present

## 2024-05-18 ENCOUNTER — Encounter (INDEPENDENT_AMBULATORY_CARE_PROVIDER_SITE_OTHER): Admitting: Otolaryngology

## 2024-05-18 ENCOUNTER — Ambulatory Visit (INDEPENDENT_AMBULATORY_CARE_PROVIDER_SITE_OTHER): Admitting: Otolaryngology

## 2024-07-01 ENCOUNTER — Telehealth (INDEPENDENT_AMBULATORY_CARE_PROVIDER_SITE_OTHER): Payer: Self-pay | Admitting: Otolaryngology

## 2024-07-01 DIAGNOSIS — B3731 Acute candidiasis of vulva and vagina: Secondary | ICD-10-CM | POA: Diagnosis not present

## 2024-07-01 NOTE — Telephone Encounter (Signed)
 Patient has an upcoming surgery but she now has a yeast infection that started Tuesday night, she is on the way to her OBGYN now and is sure she will get treatment.  She just wanted Dr Karis to be aware of this development.

## 2024-07-02 ENCOUNTER — Other Ambulatory Visit (INDEPENDENT_AMBULATORY_CARE_PROVIDER_SITE_OTHER): Payer: Self-pay | Admitting: Otolaryngology

## 2024-07-02 DIAGNOSIS — J343 Hypertrophy of nasal turbinates: Secondary | ICD-10-CM | POA: Diagnosis not present

## 2024-07-02 DIAGNOSIS — J342 Deviated nasal septum: Secondary | ICD-10-CM | POA: Diagnosis not present

## 2024-07-02 DIAGNOSIS — J329 Chronic sinusitis, unspecified: Secondary | ICD-10-CM | POA: Diagnosis not present

## 2024-07-02 DIAGNOSIS — J322 Chronic ethmoidal sinusitis: Secondary | ICD-10-CM | POA: Diagnosis not present

## 2024-07-02 DIAGNOSIS — J3489 Other specified disorders of nose and nasal sinuses: Secondary | ICD-10-CM | POA: Diagnosis not present

## 2024-07-02 DIAGNOSIS — J338 Other polyp of sinus: Secondary | ICD-10-CM | POA: Diagnosis not present

## 2024-07-02 DIAGNOSIS — J323 Chronic sphenoidal sinusitis: Secondary | ICD-10-CM | POA: Diagnosis not present

## 2024-07-02 DIAGNOSIS — J320 Chronic maxillary sinusitis: Secondary | ICD-10-CM | POA: Diagnosis not present

## 2024-07-02 HISTORY — PX: FUNCTIONAL ENDOSCOPIC SINUS SURGERY: SUR616

## 2024-07-02 HISTORY — PX: OTHER SURGICAL HISTORY: SHX169

## 2024-07-03 LAB — SURGICAL PATHOLOGY

## 2024-07-06 ENCOUNTER — Encounter (INDEPENDENT_AMBULATORY_CARE_PROVIDER_SITE_OTHER): Payer: Self-pay | Admitting: Otolaryngology

## 2024-07-06 ENCOUNTER — Ambulatory Visit (INDEPENDENT_AMBULATORY_CARE_PROVIDER_SITE_OTHER): Admitting: Otolaryngology

## 2024-07-06 VITALS — BP 135/86 | HR 74

## 2024-07-06 DIAGNOSIS — J323 Chronic sphenoidal sinusitis: Secondary | ICD-10-CM

## 2024-07-06 DIAGNOSIS — J322 Chronic ethmoidal sinusitis: Secondary | ICD-10-CM

## 2024-07-06 DIAGNOSIS — J338 Other polyp of sinus: Secondary | ICD-10-CM

## 2024-07-06 DIAGNOSIS — J320 Chronic maxillary sinusitis: Secondary | ICD-10-CM

## 2024-07-06 NOTE — Progress Notes (Signed)
 Patient ID: Jillian Macias, female   DOB: Nov 24, 1975, 48 y.o.   MRN: [id number]  Procedure: Nasal/sinus debridement status post endoscopic sinus surgery.  Indication: The patient underwent bilateral endoscopic sinus surgery in August 2025 to treat her chronic maxillary, ethmoid, and sphenoid sinusitis and polyposis.  The patient returns today complaining of significant nasal congestion and crusting.  She had a moderate amount of bleeding from the nasal cavities.  Currently the patient denies any facial pain, fever, or visual change.  Anesthesia: Topical Xylocaine  and Neo-Synephrine.  Description: The patient is placed upright in the exam chair. Both nasal cavities are sprayed with topical Xylocaine  and Neo-Synephrine.  A 0 rigid endoscope is used for the examination. The scope is first advanced past the right nostril into the right nasal cavity. A significant amount of crusting and blood clots are noted within the right nasal cavity and the maxillary/ethmoid cavities.  The crusting and blood clots are removed with suction catheters and alligator forceps, which are inserted in parallel with the rigid endoscope.  After the debridement procedure, the maxillary antrum and the ethmoid cavities are noted to be widely patent.  The sphenoid opening is also patent.  The same procedure is then repeated on the left side without exception. Similar findings are again noted on the left. The patient tolerated the procedure well.  Follow up care: The patient is instructed to perform daily nasal saline irrigation.  The patient will return for re-evaluation in approximately 3 weeks.

## 2024-07-27 ENCOUNTER — Encounter (INDEPENDENT_AMBULATORY_CARE_PROVIDER_SITE_OTHER): Payer: Self-pay | Admitting: Otolaryngology

## 2024-07-27 ENCOUNTER — Ambulatory Visit (INDEPENDENT_AMBULATORY_CARE_PROVIDER_SITE_OTHER): Admitting: Otolaryngology

## 2024-07-27 VITALS — BP 139/90 | HR 70

## 2024-07-27 DIAGNOSIS — J324 Chronic pansinusitis: Secondary | ICD-10-CM

## 2024-07-27 DIAGNOSIS — J338 Other polyp of sinus: Secondary | ICD-10-CM | POA: Diagnosis not present

## 2024-07-27 NOTE — Progress Notes (Unsigned)
 Patient ID: Jillian Macias, female   DOB: Nov 24, 1975, 48 y.o.   MRN: [id number]  Procedure: Nasal/sinus debridement status post endoscopic sinus surgery.  Indication: The patient underwent bilateral endoscopic sinus surgery in August 2025 to treat her chronic maxillary, ethmoid, and sphenoid sinusitis and polyposis.  The patient returns today complaining of significant nasal congestion and crusting.  She had a moderate amount of bleeding from the nasal cavities.  Currently the patient denies any facial pain, fever, or visual change.  Anesthesia: Topical Xylocaine  and Neo-Synephrine.  Description: The patient is placed upright in the exam chair. Both nasal cavities are sprayed with topical Xylocaine  and Neo-Synephrine.  A 0 rigid endoscope is used for the examination. The scope is first advanced past the right nostril into the right nasal cavity. A significant amount of crusting and blood clots are noted within the right nasal cavity and the maxillary/ethmoid cavities.  The crusting and blood clots are removed with suction catheters and alligator forceps, which are inserted in parallel with the rigid endoscope.  After the debridement procedure, the maxillary antrum and the ethmoid cavities are noted to be widely patent.  The sphenoid opening is also patent.  The same procedure is then repeated on the left side without exception. Similar findings are again noted on the left. The patient tolerated the procedure well.  Follow up care: The patient is instructed to perform daily nasal saline irrigation.  The patient will return for re-evaluation in approximately 3 weeks.

## 2024-08-20 ENCOUNTER — Ambulatory Visit (INDEPENDENT_AMBULATORY_CARE_PROVIDER_SITE_OTHER): Admitting: Otolaryngology

## 2024-08-20 ENCOUNTER — Encounter (INDEPENDENT_AMBULATORY_CARE_PROVIDER_SITE_OTHER): Payer: Self-pay | Admitting: Otolaryngology

## 2024-08-20 VITALS — HR 70 | Temp 97.9°F | Ht 64.0 in | Wt 118.0 lb

## 2024-08-20 DIAGNOSIS — J320 Chronic maxillary sinusitis: Secondary | ICD-10-CM | POA: Diagnosis not present

## 2024-08-20 DIAGNOSIS — J323 Chronic sphenoidal sinusitis: Secondary | ICD-10-CM

## 2024-08-20 DIAGNOSIS — J322 Chronic ethmoidal sinusitis: Secondary | ICD-10-CM

## 2024-08-20 DIAGNOSIS — J338 Other polyp of sinus: Secondary | ICD-10-CM

## 2024-08-20 NOTE — Progress Notes (Signed)
 Patient ID: Jillian Macias, female   DOB: 1976-11-02, 48 y.o.   MRN: [id number]  Procedure: Nasal/sinus debridement status post endoscopic sinus surgery.  Indication: The patient underwent bilateral endoscopic sinus surgery in August 2025 to treat her chronic maxillary, ethmoid, and sphenoid sinusitis and polyposis.  The patient returns today reporting continuing improvement in her nasal congestion and crusting.  She had no significant bleeding from the nasal cavities.  Currently the patient denies any facial pain, fever, or visual change.  Anesthesia: Topical Xylocaine  and Neo-Synephrine.  Description: The patient is placed upright in the exam chair. Both nasal cavities are sprayed with topical Xylocaine  and Neo-Synephrine.  A 0 rigid endoscope is used for the examination. The scope is first advanced past the right nostril into the right nasal cavity. A small amount of crusting is noted within the right nasal cavity and the maxillary/ethmoid cavities.  The crusting is removed with suction catheters and alligator forceps, which are inserted in parallel with the rigid endoscope.  After the debridement procedure, the maxillary antrum and the ethmoid cavities are noted to be widely patent.  The sphenoid opening is also patent.  The same procedure is then repeated on the left side without exception. Similar findings are again noted on the left. The patient tolerated the procedure well.  Follow up care: The patient is instructed to continue as needed nasal saline irrigation.  Continue Sensimist nasal spray 2 sprays each nostril daily.  The patient will return for re-evaluation in approximately 6 months.

## 2024-08-24 DIAGNOSIS — M3500 Sicca syndrome, unspecified: Secondary | ICD-10-CM | POA: Diagnosis not present

## 2024-08-24 LAB — BASIC METABOLIC PANEL WITH GFR
BUN: 16 (ref 4–21)
CO2: 20 (ref 13–22)
Chloride: 105 (ref 99–108)
Glucose: 82
Potassium: 4 meq/L (ref 3.5–5.1)
Sodium: 135 — AB (ref 137–147)

## 2024-08-24 LAB — HEPATIC FUNCTION PANEL
ALT: 14 U/L (ref 7–35)
AST: 19 (ref 13–35)
Alkaline Phosphatase: 58 (ref 25–125)
Bilirubin, Total: 0.5

## 2024-08-24 LAB — COMPREHENSIVE METABOLIC PANEL WITH GFR
Albumin: 3.8 (ref 3.5–5.0)
Calcium: 8.7 (ref 8.7–10.7)
Globulin: 5.2
eGFR: 97

## 2024-08-24 LAB — CBC AND DIFFERENTIAL
HCT: 36 (ref 36–46)
Hemoglobin: 11.1 — AB (ref 12.0–16.0)
Platelets: 235 K/uL (ref 150–400)
WBC: 3.9

## 2024-08-24 LAB — CBC: RBC: 4.05 (ref 3.87–5.11)

## 2024-08-28 ENCOUNTER — Telehealth: Payer: Self-pay | Admitting: Physician Assistant

## 2024-08-28 NOTE — Telephone Encounter (Signed)
 LVM and mailed letter to Reschedule 09/17/24 OV (Provider out of office)

## 2024-09-10 ENCOUNTER — Encounter: Payer: Self-pay | Admitting: Physician Assistant

## 2024-09-17 ENCOUNTER — Encounter: Admitting: Physician Assistant

## 2024-10-21 ENCOUNTER — Telehealth (INDEPENDENT_AMBULATORY_CARE_PROVIDER_SITE_OTHER): Payer: Self-pay | Admitting: Otolaryngology

## 2024-10-21 NOTE — Telephone Encounter (Signed)
 Returned patient 's call. She stated that she had a nose bleed this morning. She was able stop it after about 45 minutes. She still wanted to follow up with Dr. Karis. I made her an appointment for 10/22/24.

## 2024-10-21 NOTE — Telephone Encounter (Signed)
 10/21/24 Patient called in stating she is having continuous nose bleed and it's not stopping. Wanted to see Karis this morning. Let the patient know he's in surgery in the mornings. She wanted to make an afternoon appointment, I explained I would give her message to the MA for a return call.

## 2024-10-22 ENCOUNTER — Ambulatory Visit (INDEPENDENT_AMBULATORY_CARE_PROVIDER_SITE_OTHER): Admitting: Otolaryngology

## 2024-10-22 ENCOUNTER — Encounter (INDEPENDENT_AMBULATORY_CARE_PROVIDER_SITE_OTHER): Payer: Self-pay | Admitting: Otolaryngology

## 2024-10-22 VITALS — BP 132/83 | HR 68 | Temp 97.7°F

## 2024-10-22 DIAGNOSIS — R040 Epistaxis: Secondary | ICD-10-CM | POA: Diagnosis not present

## 2024-10-22 NOTE — Progress Notes (Signed)
 Patient ID: Jillian Macias, female   DOB: 07-19-1976, 48 y.o.   MRN: [id number]  Procedure:  Endoscopic control of recurrent right epistaxis  Indication: The patient is a 48 year old female who returns today complaining of recurrent right epistaxis.  She has a history of bilateral chronic rhinosinusitis, requiring sinus surgery in August 2025.  According to the patient, she was doing well until 3 weeks ago, when she started experiencing recurrent right epistaxis.  The bleeding is both anterior and posterior.  She denies any recent nasal trauma.  Description:  The right nasal cavity is sprayed with topical xylocaine  and neo-synephrine.  After adequate anesthesia is achieved, the nasal cavity is examined with a 0 rigid endoscope.  A suction catheter is inserted in parallel with the 0 endoscope, and it is used to suction blood clots from the right nasal cavity.  Hypervascular areas are noted at the anterior and superior aspect of the nasal septum.  A silver nitrate stick is inserted in parallel with the 0 endoscope.  It is used to repeatedly cauterized the hypervascular areas.  Good hemostasis is achieved.  The patient tolerated the procedure well.  Assessment: 1.  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   2.  No suspicious mass or lesion is noted today.  Plan: 1. Endoscopic cauterization of the right anterior and superior nasal septum. 2. The nasal endoscopy findings are reviewed with the patient. 3. Nasal ointment/humidifier to treat the nasal dryness. 4. The patient will return for re-evaluation in 1 month.

## 2024-11-03 DIAGNOSIS — Z20822 Contact with and (suspected) exposure to covid-19: Secondary | ICD-10-CM | POA: Diagnosis not present

## 2024-11-03 DIAGNOSIS — J019 Acute sinusitis, unspecified: Secondary | ICD-10-CM | POA: Diagnosis not present

## 2024-11-03 DIAGNOSIS — J029 Acute pharyngitis, unspecified: Secondary | ICD-10-CM | POA: Diagnosis not present

## 2024-11-03 DIAGNOSIS — R059 Cough, unspecified: Secondary | ICD-10-CM | POA: Diagnosis not present

## 2024-12-05 ENCOUNTER — Other Ambulatory Visit: Payer: Self-pay

## 2024-12-05 ENCOUNTER — Encounter (HOSPITAL_BASED_OUTPATIENT_CLINIC_OR_DEPARTMENT_OTHER): Payer: Self-pay

## 2024-12-05 ENCOUNTER — Emergency Department (HOSPITAL_BASED_OUTPATIENT_CLINIC_OR_DEPARTMENT_OTHER)
Admission: EM | Admit: 2024-12-05 | Discharge: 2024-12-05 | Disposition: A | Attending: Emergency Medicine | Admitting: Emergency Medicine

## 2024-12-05 DIAGNOSIS — N920 Excessive and frequent menstruation with regular cycle: Secondary | ICD-10-CM | POA: Diagnosis not present

## 2024-12-05 DIAGNOSIS — N939 Abnormal uterine and vaginal bleeding, unspecified: Secondary | ICD-10-CM | POA: Diagnosis present

## 2024-12-05 DIAGNOSIS — Z79899 Other long term (current) drug therapy: Secondary | ICD-10-CM | POA: Diagnosis not present

## 2024-12-05 LAB — CBC WITH DIFFERENTIAL/PLATELET
Abs Immature Granulocytes: 0.01 10*3/uL (ref 0.00–0.07)
Basophils Absolute: 0 10*3/uL (ref 0.0–0.1)
Basophils Relative: 1 %
Eosinophils Absolute: 0.1 10*3/uL (ref 0.0–0.5)
Eosinophils Relative: 2 %
HCT: 33.6 % — ABNORMAL LOW (ref 36.0–46.0)
Hemoglobin: 10.9 g/dL — ABNORMAL LOW (ref 12.0–15.0)
Immature Granulocytes: 0 %
Lymphocytes Relative: 32 %
Lymphs Abs: 1.4 10*3/uL (ref 0.7–4.0)
MCH: 27.3 pg (ref 26.0–34.0)
MCHC: 32.4 g/dL (ref 30.0–36.0)
MCV: 84.2 fL (ref 80.0–100.0)
Monocytes Absolute: 0.4 10*3/uL (ref 0.1–1.0)
Monocytes Relative: 9 %
Neutro Abs: 2.5 10*3/uL (ref 1.7–7.7)
Neutrophils Relative %: 56 %
Platelets: 261 10*3/uL (ref 150–400)
RBC: 3.99 MIL/uL (ref 3.87–5.11)
RDW: 13.7 % (ref 11.5–15.5)
WBC: 4.4 10*3/uL (ref 4.0–10.5)
nRBC: 0 % (ref 0.0–0.2)

## 2024-12-05 LAB — URINALYSIS, ROUTINE W REFLEX MICROSCOPIC
Bacteria, UA: NONE SEEN
Bilirubin Urine: NEGATIVE
Glucose, UA: NEGATIVE mg/dL
Ketones, ur: NEGATIVE mg/dL
Leukocytes,Ua: NEGATIVE
Nitrite: NEGATIVE
Protein, ur: NEGATIVE mg/dL
Specific Gravity, Urine: 1.005 (ref 1.005–1.030)
pH: 6.5 (ref 5.0–8.0)

## 2024-12-05 LAB — BASIC METABOLIC PANEL WITH GFR
Anion gap: 10 (ref 5–15)
BUN: 16 mg/dL (ref 6–20)
CO2: 21 mmol/L — ABNORMAL LOW (ref 22–32)
Calcium: 9 mg/dL (ref 8.9–10.3)
Chloride: 106 mmol/L (ref 98–111)
Creatinine, Ser: 0.79 mg/dL (ref 0.44–1.00)
GFR, Estimated: >60 mL/min
Glucose, Bld: 96 mg/dL (ref 70–99)
Potassium: 3.7 mmol/L (ref 3.5–5.1)
Sodium: 137 mmol/L (ref 135–145)

## 2024-12-05 LAB — PREGNANCY, URINE: Preg Test, Ur: NEGATIVE

## 2024-12-05 MED ORDER — MEDROXYPROGESTERONE ACETATE 5 MG PO TABS: 10.0000 mg | ORAL_TABLET | Freq: Three times a day (TID) | ORAL | 0 refills | Status: AC

## 2024-12-05 NOTE — ED Triage Notes (Signed)
 Arrives POV. Has had heavy vaginal bleeding since 10 am. Going through one regular size tampon an hour. Denies abdominal pain, vaginal pain or dizziness.

## 2024-12-05 NOTE — ED Provider Notes (Signed)
 " Jillian Macias EMERGENCY DEPARTMENT AT [facility] Provider Note   CSN: [id number] Arrival date & time: 12/05/24  0000     Patient presents with: Vaginal Bleeding   Jillian Macias is a 49 y.o. female.    Vaginal Bleeding    49 year old female with medical history significant for Sjogren syndrome presenting to the emergency department with heavy vaginal bleeding since earlier this morning.  The patient states that she has regular menstrual cycles and has not yet gone through menopause.  She normally does not have as heavy menstrual cycles.  She states that she has been going through roughly 1 tampon per hour.  Bleeding has been ongoing since 10 AM this morning.  She denies any abdominal pain or pelvic pain.  She denies any vaginal pain, dysuria or frequency, no lightheadedness, shortness of breath or fatigue.  Prior to Admission medications  Medication Sig Start Date End Date Taking? Authorizing Provider  medroxyPROGESTERone  (PROVERA ) 5 MG tablet Take 2 tablets (10 mg total) by mouth 3 (three) times daily. 12/05/24 12/15/24 Yes Jerrol Agent, MD  ascorbic acid (VITAMIN C) 1000 MG tablet Take 1,000 mg by mouth daily.    [provider]  Cholecalciferol (VITAMIN D-3 PO) Take 1 tablet by mouth daily.    [provider]  hydroxychloroquine  (PLAQUENIL ) 200 MG tablet Take by mouth 2 (two) times daily.    [provider]  ketoconazole  (NIZORAL ) 2 % cream APPLY TO AFFECTED AREA 1-2 TIMES DAILY 08/26/23   Job Lukes, PA    Allergies: Dust mite extract and Mixed feathers    Review of Systems  Genitourinary:  Positive for vaginal bleeding.  All other systems reviewed and are negative.   Updated Vital Signs BP (!) 144/99   Pulse 77   Temp 98.6 F (37 C) (Oral)   Resp 17   LMP 11/07/2024 (Approximate)   SpO2 100%   Physical Exam Vitals and nursing note reviewed. Exam conducted with a chaperone present.  Constitutional:      General: She is not in  acute distress.    Appearance: She is well-developed.  HENT:     Head: Normocephalic and atraumatic.  Eyes:     Conjunctiva/sclera: Conjunctivae normal.  Cardiovascular:     Rate and Rhythm: Normal rate and regular rhythm.     Heart sounds: No murmur heard. Pulmonary:     Effort: Pulmonary effort is normal. No respiratory distress.     Breath sounds: Normal breath sounds.  Abdominal:     Palpations: Abdomen is soft.     Tenderness: There is no abdominal tenderness.  Genitourinary:    Comments: Active bleeding from the cervical os noted on speculum exam, no cervical masses or lesions noted, patient declined bimanual exam Musculoskeletal:        General: No swelling.     Cervical back: Neck supple.  Skin:    General: Skin is warm and dry.     Capillary Refill: Capillary refill takes less than 2 seconds.  Neurological:     Mental Status: She is alert.  Psychiatric:        Mood and Affect: Mood normal.     (all labs ordered are listed, but only abnormal results are displayed) Labs Reviewed  URINALYSIS, ROUTINE W REFLEX MICROSCOPIC - Abnormal; Notable for the following components:      Result Value   Color, Urine COLORLESS (*)    Hgb urine dipstick SMALL (*)    All other components within normal limits  CBC  WITH DIFFERENTIAL/PLATELET - Abnormal; Notable for the following components:   Hemoglobin 10.9 (*)    HCT 33.6 (*)    All other components within normal limits  BASIC METABOLIC PANEL WITH GFR - Abnormal; Notable for the following components:   CO2 21 (*)    All other components within normal limits  PREGNANCY, URINE    EKG: None  Radiology: No results found.   Procedures   Medications Ordered in the ED - No data to display                                  Medical Decision Making Amount and/or Complexity of Data Reviewed Labs: ordered.  Risk Prescription drug management.    49 year old female with medical history significant for Sjogren syndrome  presenting to the emergency department with heavy vaginal bleeding since earlier this morning.  The patient states that she has regular menstrual cycles and has not yet gone through menopause.  She normally does not have as heavy menstrual cycles.  She states that she has been going through roughly 1 tampon per hour.  Bleeding has been ongoing since 10 AM this morning.  She denies any abdominal pain or pelvic pain.  She denies any vaginal pain, dysuria or frequency, no lightheadedness, shortness of breath or fatigue.  On arrival, the patient was afebrile, not tachycardic or tachypneic, hypertensive BP 165/104, saturating 100% on room air.  On exam the patient had active bleeding from the cervical os with no mass or lesion noted.  She has no pelvic tenderness, no abdominal tenderness.  She declined bimanual exam.  Laboratory evaluation revealed mild anemia to 10.9, BMP unremarkable, urinalysis with hematuria, negative for UTI, urine pregnancy negative.  Patient has no abdominal pain or pelvic pain and she has no tenderness on exam.  I do not think an emergent pelvic ultrasound is indicated at this time.  Given the patient's heavy menstrual bleeding, I advised that she follow-up closely with her gynecologist.  She has no symptoms of anemia and her hemoglobin is stable and she is vitally stable.  Will discharge the patient on a course of Provera , return precautions provided, plan for outpatient follow-up with OB/GYN.     Final diagnoses:  Menorrhagia with regular cycle    ED Discharge Orders          Ordered    medroxyPROGESTERone  (PROVERA ) 5 MG tablet  3 times daily        12/05/24 0341               Jerrol Agent, MD 12/05/24 0423  "

## 2024-12-05 NOTE — Discharge Instructions (Addendum)
 Your laboratory evaluation was overall reassuring.  Your CBC revealed mild anemia with a hemoglobin of 10.9.  Follow-up with your OB/GYN to ensure resolution.  If you develop pelvic pain associated with bleeding, return to the emergency department for consideration for emergent ultrasound.

## 2024-12-24 ENCOUNTER — Ambulatory Visit (INDEPENDENT_AMBULATORY_CARE_PROVIDER_SITE_OTHER): Admitting: Otolaryngology

## 2025-02-18 ENCOUNTER — Ambulatory Visit (INDEPENDENT_AMBULATORY_CARE_PROVIDER_SITE_OTHER): Admitting: Otolaryngology
